# Patient Record
Sex: Male | Born: 1980 | Race: Black or African American | Hispanic: No | Marital: Married | Smoking: Never smoker
Health system: Southern US, Community
[De-identification: ages and names within clinical notes are randomized; demographics above are authoritative.]

## PROBLEM LIST (undated history)

## (undated) DIAGNOSIS — Z789 Other specified health status: Secondary | ICD-10-CM

---

## 2013-06-15 ENCOUNTER — Ambulatory Visit: Payer: Self-pay | Admitting: Family Medicine

## 2017-03-07 ENCOUNTER — Ambulatory Visit: Payer: 59 | Admitting: Urology

## 2017-03-07 ENCOUNTER — Encounter: Payer: Self-pay | Admitting: Urology

## 2017-03-07 VITALS — BP 168/91 | HR 80 | Ht 70.0 in | Wt 200.0 lb

## 2017-03-07 DIAGNOSIS — N4340 Spermatocele of epididymis, unspecified: Secondary | ICD-10-CM | POA: Diagnosis not present

## 2017-03-07 NOTE — Progress Notes (Signed)
03/07/2017 3:36 PM   Renaee Munda. 11/28/1980 [id number]  Referring provider: Dione Housekeeper, MD [address] [place], [place] [address]  Chief Complaint  Patient presents with  . New Patient (Initial Visit)    HPI: Douglas Sampson is a 37 year old male seen in consultation at the request of Dr. Greggory Stallion for evaluation of a left hemiscrotal mass.  He was initially seen for this problem in 2015 and was noted to have a 2.3 x 1.3 x 2.1 cystic mass in the left hemiscrotum.  He was seen by urology at [facility] on 08/11/2014 and the mass had increased in size to 2.2 x 3.4 x 2.0 cm.  He also had a 2 mm intratesticular cyst.  He has not been seen since 2016.  He has noted some increased size in the left hemiscrotal mass along with mild discomfort.  He has no voiding complaints.  Denies dysuria or gross hematuria.  Denies flank, abdominal, pelvic or scrotal pain.  A scrotal sonogram was ordered but has not yet been scheduled.    PMH: No past medical history on file.  Surgical History: History reviewed. No pertinent surgical history.  Home Medications:  Allergies as of 03/07/2017   No Known Allergies     Medication List    as of 03/07/2017 11:59 PM   You have not been prescribed any medications.     Allergies: No Known Allergies  Family History: Family History  Problem Relation Age of Onset  . Prostate cancer Maternal Uncle   . Kidney disease Neg Hx   . Kidney cancer Neg Hx     Social History:  reports that  has never smoked. he has never used smokeless tobacco. He reports that he does not drink alcohol or use drugs.  ROS: UROLOGY Frequent Urination?: No Hard to postpone urination?: No Burning/pain with urination?: No Get up at night to urinate?: No Leakage of urine?: No Urine stream starts and stops?: No Trouble starting stream?: No Do you have to strain to urinate?: No Blood in urine?: No Urinary tract infection?: No Sexually transmitted disease?: No Injury  to kidneys or bladder?: No Painful intercourse?: No Weak stream?: No Erection problems?: No Penile pain?: No  Gastrointestinal Nausea?: No Vomiting?: No Indigestion/heartburn?: No Diarrhea?: No Constipation?: No  Constitutional Fever: No Night sweats?: No Weight loss?: No Fatigue?: No  Skin Skin rash/lesions?: No Itching?: No  Eyes Blurred vision?: No Double vision?: No  Ears/Nose/Throat Sore throat?: No Sinus problems?: No  Hematologic/Lymphatic Swollen glands?: No Easy bruising?: No  Cardiovascular Leg swelling?: No Chest pain?: No  Respiratory Cough?: No Shortness of breath?: No  Endocrine Excessive thirst?: No  Musculoskeletal Back pain?: No Joint pain?: No  Neurological Headaches?: No Dizziness?: No  Psychologic Depression?: No Anxiety?: No  Physical Exam: BP (!) 168/91   Pulse 80   Ht 5\' 10"  (1.778 m)   Wt 200 lb (90.7 kg)   BMI 28.70 kg/m   Constitutional:  Alert and oriented, No acute distress. HEENT: [place] AT, moist mucus membranes.  Trachea midline, no masses. Cardiovascular: No clubbing, cyanosis, or edema.  CV RRR Respiratory: Normal respiratory effort, no increased work of breathing.  Lungs clear GI: Abdomen is soft, nontender, nondistended, no abdominal masses GU: No CVA tenderness.  Penis without lesions.  Right testes descended bilaterally without masses or tenderness. ~5 cm cystic supratesticular mass left hemiscrotum.  Left testis palpably normal. Skin: No rashes, bruises or suspicious lesions. Lymph: No cervical or inguinal adenopathy. Neurologic: Grossly  intact, no focal deficits, moving all 4 extremities. Psychiatric: Normal mood and affect.   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  I discussed management options of continued observation versus excision.  The decision for surgery should be based on his level of discomfort and interference with routine  activities.  He would like to think over these options.  Will review his scrotal ultrasound after it is performed.   Riki AltesScott C Jerre Diguglielmo, MD  [facility] [address], [address] [place], [place] [address] [phone]

## 2017-03-09 ENCOUNTER — Encounter: Payer: Self-pay | Admitting: Urology

## 2017-04-03 ENCOUNTER — Encounter: Payer: Self-pay | Admitting: Urology

## 2017-04-03 ENCOUNTER — Ambulatory Visit: Payer: 59 | Admitting: Urology

## 2017-04-03 VITALS — BP 139/81 | HR 71 | Resp 16 | Ht 69.0 in | Wt 204.0 lb

## 2017-04-03 DIAGNOSIS — N433 Hydrocele, unspecified: Secondary | ICD-10-CM | POA: Diagnosis not present

## 2017-04-04 NOTE — Progress Notes (Signed)
04/03/2017 7:06 AM   Douglas Mundaichard L Amster Jr. 10-25-1980 [id number]  Referring provider: Rayetta HumphreyGeorge, Sionne A, MD [address] [place], [place] [id number]  Chief complaint: Follow-up  HPI: 37 year old male seen on 03/07/2017 for an enlarging left hemiscrotal mass.  [facility].  The testes were normal in appearance.  The cystic mass in the left hemiscrotum measured 4.8 x 2.0 x 5.3 cm which is approximately doubled in size from the prior scrotal sonogram of 2016.   PMH: No past medical history on file.  Surgical History: No past surgical history on file.  Home Medications:  Allergies as of 04/03/2017   No Known Allergies     Medication List    as of 04/03/2017 11:59 PM   You have not been prescribed any medications.     Allergies: No Known Allergies  Family History: Family History  Problem Relation Age of Onset  . Prostate cancer Maternal Uncle   . Kidney disease Neg Hx   . Kidney cancer Neg Hx     Social History:  reports that  has never smoked. he has never used smokeless tobacco. He reports that he does not drink alcohol or use drugs.  ROS: UROLOGY Frequent Urination?: No Hard to postpone urination?: No Burning/pain with urination?: No Get up at night to urinate?: No Leakage of urine?: No Urine stream starts and stops?: No Trouble starting stream?: No Do you have to strain to urinate?: No Blood in urine?: No Urinary tract infection?: No Sexually transmitted disease?: No Injury to kidneys or bladder?: No Painful intercourse?: No Weak stream?: No Erection problems?: No Penile pain?: No  Gastrointestinal Nausea?: No Vomiting?: No Indigestion/heartburn?: No Diarrhea?: No Constipation?: No  Constitutional Fever: No Night sweats?: No Weight loss?: No Fatigue?: No  Skin Skin rash/lesions?: No Itching?: No  Eyes Blurred vision?: No Double vision?: No  Ears/Nose/Throat Sore throat?: No Sinus  problems?: No  Hematologic/Lymphatic Swollen glands?: No Easy bruising?: No  Cardiovascular Leg swelling?: No Chest pain?: No  Respiratory Cough?: No Shortness of breath?: No  Endocrine Excessive thirst?: No  Musculoskeletal Back pain?: No Joint pain?: No  Neurological Headaches?: No Dizziness?: No  Psychologic Depression?: No Anxiety?: No  Physical Exam: BP 139/81   Pulse 71   Resp 16   Ht 5\' 9"  (1.753 m)   Wt 204 lb (92.5 kg)   SpO2 98%   BMI 30.13 kg/m   Constitutional:  Alert and oriented, No acute distress. HEENT: [place] AT, moist mucus membranes.  Trachea midline, no masses. Cardiovascular: No clubbing, cyanosis, or edema. Respiratory: Normal respiratory effort, no increased work of breathing. GI: Abdomen is soft, nontender, nondistended, no abdominal masses GU: No CVA tenderness Lymph: No cervical or inguinal lymphadenopathy. Skin: No rashes, bruises or suspicious lesions. Neurologic: Grossly intact, no focal deficits, moving all 4 extremities. Psychiatric: Normal mood and affect.  Laboratory Data: No results found for: WBC, HGB, HCT, MCV, PLT  No results found for: CREATININE  No results found for: PSA  No results found for: TESTOSTERONE  No results found for: HGBA1C  Urinalysis No results found for: COLORURINE, APPEARANCEUR, LABSPEC, PHURINE, GLUCOSEU, HGBUR, BILIRUBINUR, KETONESUR, PROTEINUR, UROBILINOGEN, NITRITE, LEUKOCYTESUR  Pertinent Imaging: N/a  Assessment & Plan:   37 year old male with an enlarging left hemiscrotal mass with mild symptoms.  We discussed options of continued surveillance versus excision.  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  Due to the doubling in size over the last  several years he would like to schedule excision.  The procedure was discussed in detail including potential risks of bleeding/hematoma, infection and recurrence.  The potential need for a postoperative drain was discussed.  He indicated  all questions were answered to his satisfaction and desires to proceed.    Riki Altes, MD  [facility] [address], [address] [place], [place] [address] [phone]

## 2017-04-13 ENCOUNTER — Encounter: Payer: Self-pay | Admitting: Urology

## 2017-04-16 ENCOUNTER — Other Ambulatory Visit: Payer: Self-pay | Admitting: Radiology

## 2017-04-16 DIAGNOSIS — N433 Hydrocele, unspecified: Secondary | ICD-10-CM

## 2017-05-07 ENCOUNTER — Encounter
Admission: RE | Admit: 2017-05-07 | Discharge: 2017-05-07 | Disposition: A | Discharge status: Home / Self Care | Payer: 59 | Source: Ambulatory Visit | Attending: Urology | Admitting: Urology

## 2017-05-07 ENCOUNTER — Other Ambulatory Visit: Payer: Self-pay

## 2017-05-07 NOTE — Patient Instructions (Signed)
Your procedure is scheduled on: 05-13-17 TUESDAY Report to Same Day Surgery 2nd floor medical mall [facility](Medical Mall Entrance-take elevator on left to 2nd floor.  Check in with surgery information desk.) To find out your arrival time please call [phone] between 1PM - 3PM on 05-12-17 MONDAY  Remember: Instructions that are not followed completely may result in serious medical risk, up to and including death, or upon the discretion of your surgeon and anesthesiologist your surgery may need to be rescheduled.    _x___ 1. Do not eat food after midnight the night before your procedure. You may drink clear liquids up to 2 hours before you are scheduled to arrive at the hospital for your procedure.  Do not drink clear liquids within 2 hours of your scheduled arrival to the hospital.  Clear liquids include  --Water or Apple juice without pulp  --Clear carbohydrate beverage such as ClearFast or Gatorade  --Black Coffee or Clear Tea (No milk, no creamers, do not add anything to the coffee or Tea Type 1 and type 2 diabetics should only drink water.  No gum chewing or hard candies.     __x__ 2. No Alcohol for 24 hours before or after surgery.   __x__3. No Smoking or e-cigarettes for 24 prior to surgery.  Do not use any chewable tobacco products for at least 6 hour prior to surgery   ____  4. Bring all medications with you on the day of surgery if instructed.    __x__ 5. Notify your doctor if there is any change in your medical condition     (cold, fever, infections).    x___6. On the morning of surgery brush your teeth with toothpaste and water.  You may rinse your mouth with mouth wash if you wish.  Do not swallow any toothpaste or mouthwash.   Do not wear jewelry, make-up, hairpins, clips or nail polish.  Do not wear lotions, powders, or perfumes. You may wear deodorant.  Do not shave 48 hours prior to surgery. Men may shave face and neck.  Do not bring valuables to the hospital.    [facility] is not responsible for any belongings or valuables.               Contacts, dentures or bridgework may not be worn into surgery.  Leave your suitcase in the car. After surgery it may be brought to your room.  For patients admitted to the hospital, discharge time is determined by your treatment team.  _  Patients discharged the day of surgery will not be allowed to drive home.  You will need someone to drive you home and stay with you the night of your procedure.   ____ Take anti-hypertensive listed below, cardiac, seizure, asthma, anti-reflux and psychiatric medicines. These include:  1. NONE  2.  3.  4.  5.  6.  ____Fleets enema or Magnesium Citrate as directed.   ____ Use CHG Soap or sage wipes as directed on instruction sheet   ____ Use inhalers on the day of surgery and bring to hospital day of surgery  ____ Stop Metformin and Janumet 2 days prior to surgery.    ____ Take 1/2 of usual insulin dose the night before surgery and none on the morning surgery.   ____ Follow recommendations from Cardiologist, Pulmonologist or PCP regarding stopping Aspirin, Coumadin, Plavix ,Eliquis, Effient, or Pradaxa, and Pletal.  X____Stop Anti-inflammatories such as Advil, Aleve, Ibuprofen, Motrin, Naproxen, Naprosyn, Goodies powders or aspirin products NOW-OK  to take Tylenol    ____ Stop supplements until after surgery.     ____ Bring C-Pap to the hospital.

## 2017-05-13 ENCOUNTER — Encounter: Payer: Self-pay | Admitting: Urology

## 2017-05-13 ENCOUNTER — Ambulatory Visit
Admission: RE | Admit: 2017-05-13 | Discharge: 2017-05-13 | Disposition: A | Discharge status: Home / Self Care | Payer: 59 | Source: Ambulatory Visit | Attending: Urology | Admitting: Urology

## 2017-05-13 ENCOUNTER — Ambulatory Visit: Payer: 59 | Admitting: Anesthesiology

## 2017-05-13 ENCOUNTER — Encounter
Admission: RE | Disposition: A | Discharge status: Home / Self Care | Payer: Self-pay | Source: Ambulatory Visit | Attending: Urology

## 2017-05-13 ENCOUNTER — Encounter: Payer: Self-pay | Admitting: *Deleted

## 2017-05-13 ENCOUNTER — Ambulatory Visit: Payer: 59

## 2017-05-13 DIAGNOSIS — N4341 Spermatocele of epididymis, single: Secondary | ICD-10-CM | POA: Diagnosis not present

## 2017-05-13 DIAGNOSIS — N433 Hydrocele, unspecified: Secondary | ICD-10-CM | POA: Insufficient documentation

## 2017-05-13 DIAGNOSIS — R049 Hemorrhage from respiratory passages, unspecified: Secondary | ICD-10-CM

## 2017-05-13 DIAGNOSIS — N5089 Other specified disorders of the male genital organs: Secondary | ICD-10-CM | POA: Diagnosis present

## 2017-05-13 DIAGNOSIS — R042 Hemoptysis: Secondary | ICD-10-CM | POA: Diagnosis not present

## 2017-05-13 DIAGNOSIS — N4340 Spermatocele of epididymis, unspecified: Secondary | ICD-10-CM

## 2017-05-13 HISTORY — PX: SPERMATOCELECTOMY: SHX2420

## 2017-05-13 SURGERY — EXCISION, SPERMATOCELE
Anesthesia: General | Laterality: Left | Wound class: Clean Contaminated

## 2017-05-13 MED ORDER — ONDANSETRON HCL 4 MG/2ML IJ SOLN
INTRAMUSCULAR | Status: DC | PRN
  Administered 2017-05-13: 4 mg via INTRAVENOUS

## 2017-05-13 MED ORDER — LACTATED RINGERS IV SOLN
INTRAVENOUS | Status: DC
  Administered 2017-05-13: 07:00:00 via INTRAVENOUS

## 2017-05-13 MED ORDER — ACETAMINOPHEN 10 MG/ML IV SOLN
INTRAVENOUS | Status: AC
  Filled 2017-05-13: qty 100

## 2017-05-13 MED ORDER — PROPOFOL 10 MG/ML IV BOLUS
INTRAVENOUS | Status: DC | PRN
  Administered 2017-05-13: 200 mg via INTRAVENOUS

## 2017-05-13 MED ORDER — GLYCOPYRROLATE 0.2 MG/ML IJ SOLN
INTRAMUSCULAR | Status: DC | PRN
  Administered 2017-05-13: 0.2 mg via INTRAVENOUS

## 2017-05-13 MED ORDER — FAMOTIDINE 20 MG PO TABS
20.0000 mg | ORAL_TABLET | Freq: Once | ORAL | Status: AC
  Administered 2017-05-13: 20 mg via ORAL

## 2017-05-13 MED ORDER — EPHEDRINE SULFATE 50 MG/ML IJ SOLN
INTRAMUSCULAR | Status: AC
  Filled 2017-05-13: qty 1

## 2017-05-13 MED ORDER — PROPOFOL 10 MG/ML IV BOLUS
INTRAVENOUS | Status: AC
  Filled 2017-05-13: qty 40

## 2017-05-13 MED ORDER — PHENYLEPHRINE HCL 10 MG/ML IJ SOLN
INTRAMUSCULAR | Status: AC
  Filled 2017-05-13: qty 1

## 2017-05-13 MED ORDER — LIDOCAINE HCL (PF) 1 % IJ SOLN
INTRAMUSCULAR | Status: AC
  Filled 2017-05-13: qty 30

## 2017-05-13 MED ORDER — CEFAZOLIN SODIUM-DEXTROSE 2-4 GM/100ML-% IV SOLN
2.0000 g | INTRAVENOUS | Status: AC
  Administered 2017-05-13: 2 g via INTRAVENOUS
  Filled 2017-05-13: qty 100

## 2017-05-13 MED ORDER — MIDAZOLAM HCL 2 MG/2ML IJ SOLN
INTRAMUSCULAR | Status: AC
  Filled 2017-05-13: qty 2

## 2017-05-13 MED ORDER — GLYCOPYRROLATE 0.2 MG/ML IJ SOLN
INTRAMUSCULAR | Status: AC
  Filled 2017-05-13: qty 2

## 2017-05-13 MED ORDER — BUPIVACAINE HCL 0.5 % IJ SOLN
INTRAMUSCULAR | Status: DC | PRN
  Administered 2017-05-13: 4 mL

## 2017-05-13 MED ORDER — SUCCINYLCHOLINE CHLORIDE 20 MG/ML IJ SOLN
INTRAMUSCULAR | Status: AC
  Filled 2017-05-13: qty 1

## 2017-05-13 MED ORDER — FENTANYL CITRATE (PF) 100 MCG/2ML IJ SOLN
INTRAMUSCULAR | Status: DC | PRN
  Administered 2017-05-13 (×2): 50 ug via INTRAVENOUS

## 2017-05-13 MED ORDER — FAMOTIDINE 20 MG PO TABS
ORAL_TABLET | ORAL | Status: AC
  Administered 2017-05-13: 20 mg via ORAL
  Filled 2017-05-13: qty 1

## 2017-05-13 MED ORDER — DEXAMETHASONE SODIUM PHOSPHATE 10 MG/ML IJ SOLN
INTRAMUSCULAR | Status: DC | PRN
  Administered 2017-05-13: 10 mg via INTRAVENOUS

## 2017-05-13 MED ORDER — LIDOCAINE HCL (CARDIAC) 20 MG/ML IV SOLN
INTRAVENOUS | Status: DC | PRN
  Administered 2017-05-13: 100 mg via INTRAVENOUS

## 2017-05-13 MED ORDER — CEFAZOLIN SODIUM-DEXTROSE 2-3 GM-%(50ML) IV SOLR
INTRAVENOUS | Status: AC
  Filled 2017-05-13: qty 50

## 2017-05-13 MED ORDER — ACETAMINOPHEN 10 MG/ML IV SOLN
INTRAVENOUS | Status: DC | PRN
  Administered 2017-05-13: 1000 mg via INTRAVENOUS

## 2017-05-13 MED ORDER — LIDOCAINE HCL (PF) 2 % IJ SOLN
INTRAMUSCULAR | Status: AC
  Filled 2017-05-13: qty 10

## 2017-05-13 MED ORDER — FENTANYL CITRATE (PF) 100 MCG/2ML IJ SOLN
INTRAMUSCULAR | Status: AC
  Filled 2017-05-13: qty 2

## 2017-05-13 MED ORDER — MIDAZOLAM HCL 2 MG/2ML IJ SOLN
INTRAMUSCULAR | Status: DC | PRN
  Administered 2017-05-13: 2 mg via INTRAVENOUS

## 2017-05-13 MED ORDER — KETOROLAC TROMETHAMINE 30 MG/ML IJ SOLN
INTRAMUSCULAR | Status: DC | PRN
  Administered 2017-05-13: 30 mg via INTRAVENOUS

## 2017-05-13 MED ORDER — ONDANSETRON HCL 4 MG/2ML IJ SOLN
INTRAMUSCULAR | Status: AC
  Filled 2017-05-13: qty 2

## 2017-05-13 MED ORDER — BUPIVACAINE HCL (PF) 0.5 % IJ SOLN
INTRAMUSCULAR | Status: AC
  Filled 2017-05-13: qty 30

## 2017-05-13 MED ORDER — OXYCODONE-ACETAMINOPHEN 5-325 MG PO TABS: 1.0000 | ORAL_TABLET | Freq: Four times a day (QID) | ORAL | 0 refills | Status: DC | PRN

## 2017-05-13 SURGICAL SUPPLY — 36 items
BLADE CLIPPER SURG (BLADE) ×4 IMPLANT
BLADE SURG 15 STRL LF DISP TIS (BLADE) ×2 IMPLANT
BLADE SURG 15 STRL SS (BLADE) ×2
CANISTER SUCT 1200ML W/VALVE (MISCELLANEOUS) ×4 IMPLANT
CHLORAPREP W/TINT 26ML (MISCELLANEOUS) ×4 IMPLANT
DERMABOND ADVANCED (GAUZE/BANDAGES/DRESSINGS) ×2
DERMABOND ADVANCED .7 DNX12 (GAUZE/BANDAGES/DRESSINGS) ×2 IMPLANT
DRAIN PENROSE 1/4X12 LTX (DRAIN) ×4 IMPLANT
DRAPE LAPAROTOMY 77X122 PED (DRAPES) ×4 IMPLANT
DRSG GAUZE FLUFF 36X18 (GAUZE/BANDAGES/DRESSINGS) ×4 IMPLANT
ELECT REM PT RETURN 9FT ADLT (ELECTROSURGICAL) ×4
ELECTRODE REM PT RTRN 9FT ADLT (ELECTROSURGICAL) ×2 IMPLANT
GAUZE SPONGE 4X4 12PLY STRL (GAUZE/BANDAGES/DRESSINGS) IMPLANT
GLOVE BIO SURGEON STRL SZ8 (GLOVE) ×4 IMPLANT
GOWN STRL REUS W/ TWL LRG LVL3 (GOWN DISPOSABLE) ×2 IMPLANT
GOWN STRL REUS W/TWL LRG LVL3 (GOWN DISPOSABLE) ×2
GOWN STRL REUS W/TWL XL LVL4 (GOWN DISPOSABLE) ×4 IMPLANT
KIT TURNOVER KIT A (KITS) ×4 IMPLANT
LABEL OR SOLS (LABEL) ×4 IMPLANT
NEEDLE HYPO 25X1 1.5 SAFETY (NEEDLE) ×4 IMPLANT
NS IRRIG 500ML POUR BTL (IV SOLUTION) ×4 IMPLANT
PACK BASIN MINOR ARMC (MISCELLANEOUS) ×4 IMPLANT
SUPPORETR ATHLETIC LG (MISCELLANEOUS) ×2 IMPLANT
SUPPORTER ATHLETIC LG (MISCELLANEOUS) ×4
SUT CHROMIC 3 0 PS 2 (SUTURE) IMPLANT
SUT CHROMIC 3 0 SH 27 (SUTURE) ×8 IMPLANT
SUT ETHILON 3-0 FS-10 30 BLK (SUTURE)
SUT ETHILON NAB PS2 4-0 18IN (SUTURE) IMPLANT
SUT MNCRL 3-0 UNDYED SH (SUTURE) ×2 IMPLANT
SUT MONOCRYL 3-0 UNDYED (SUTURE) ×2
SUT VIC AB 3-0 SH 27 (SUTURE) ×4
SUT VIC AB 3-0 SH 27X BRD (SUTURE) ×4 IMPLANT
SUT VIC AB 4-0 SH 27 (SUTURE)
SUT VIC AB 4-0 SH 27XANBCTRL (SUTURE) IMPLANT
SUTURE EHLN 3-0 FS-10 30 BLK (SUTURE) IMPLANT
SYR 10ML LL (SYRINGE) ×4 IMPLANT

## 2017-05-13 NOTE — Transfer of Care (Signed)
Immediate Anesthesia Transfer of Care Note  Patient: Douglas MundaRichard L Newbury Jr.  Procedure(s) Performed: Procedure(s): SPERMATOCELECTOMY  Patient Location: PACU  Anesthesia Type:General  Level of Consciousness: sedated  Airway & Oxygen Therapy: Patient Spontanous Breathing and Patient connected to face mask oxygen  Post-op Assessment: Report given to RN and Post -op Vital signs reviewed and stable  Post vital signs: Reviewed and stable  Last Vitals:  Vitals:   05/13/17 0635 05/13/17 0835  BP: 135/86 (!) 159/70  Pulse: 72 91  Resp: 18 18  Temp: 36.8 C 36.8 C  SpO2: 100% 92%    Complications: No apparent anesthesia complications

## 2017-05-13 NOTE — Anesthesia Post-op Follow-up Note (Signed)
Anesthesia QCDR form completed.        

## 2017-05-13 NOTE — Anesthesia Preprocedure Evaluation (Signed)
Anesthesia Evaluation  Patient identified by MRN, date of birth, ID band Patient awake    Reviewed: Allergy & Precautions, NPO status , Patient's Chart, lab work & pertinent test results  History of Anesthesia Complications Negative for: history of anesthetic complications  Airway Mallampati: II  TM Distance: >3 FB Neck ROM: Full    Dental no notable dental hx.    Pulmonary neg pulmonary ROS, neg sleep apnea, neg COPD,    breath sounds clear to auscultation- rhonchi (-) wheezing      Cardiovascular Exercise Tolerance: Good (-) hypertension(-) CAD and (-) Past MI  Rhythm:Regular Rate:Normal - Systolic murmurs and - Diastolic murmurs    Neuro/Psych negative neurological ROS  negative psych ROS   GI/Hepatic negative GI ROS, Neg liver ROS,   Endo/Other  negative endocrine ROSneg diabetes  Renal/GU negative Renal ROS     Musculoskeletal negative musculoskeletal ROS (+)   Abdominal (+) + obese,   Peds  Hematology negative hematology ROS (+)   Anesthesia Other Findings   Reproductive/Obstetrics                             Anesthesia Physical Anesthesia Plan  ASA: II  Anesthesia Plan: General   Post-op Pain Management:    Induction: Intravenous  PONV Risk Score and Plan: 1 and Ondansetron, Dexamethasone and Midazolam  Airway Management Planned: LMA  Additional Equipment:   Intra-op Plan:   Post-operative Plan:   Informed Consent: I have reviewed the patients History and Physical, chart, labs and discussed the procedure including the risks, benefits and alternatives for the proposed anesthesia with the patient or authorized representative who has indicated his/her understanding and acceptance.   Dental advisory given  Plan Discussed with: CRNA and Anesthesiologist  Anesthesia Plan Comments:         Anesthesia Quick Evaluation

## 2017-05-13 NOTE — Anesthesia Postprocedure Evaluation (Signed)
Anesthesia Post Note  Patient: Douglas MundaRichard L Mcguinness Jr.  Procedure(s) Performed: SPERMATOCELECTOMY  Patient location during evaluation: PACU Anesthesia Type: General Level of consciousness: awake and alert and oriented Pain management: pain level controlled Vital Signs Assessment: post-procedure vital signs reviewed and stable Respiratory status: spontaneous breathing, nonlabored ventilation and respiratory function stable Cardiovascular status: blood pressure returned to baseline and stable Postop Assessment: no signs of nausea or vomiting Anesthetic complications: no     Last Vitals:  Vitals:   05/13/17 0850 05/13/17 0905  BP: 114/74 (!) 143/84  Pulse: (!) 105 99  Resp: 16 18  Temp:    SpO2: 97% 95%    Last Pain:  Vitals:   05/13/17 0905  TempSrc:   PainSc: 0-No pain                 Cythnia Osmun

## 2017-05-13 NOTE — Discharge Instructions (Signed)

## 2017-05-13 NOTE — Progress Notes (Signed)
Pt noted to have frequent coughing. This RN auscultated pt and noted bilateral congestion. Dr. Priscella MannPenwarden notified. Acknowledged. No new orders. Yasenia Reedy E 9:20 AM 05/13/2017

## 2017-05-13 NOTE — Anesthesia Procedure Notes (Signed)
Procedure Name: LMA Insertion Date/Time: 05/13/2017 7:37 AM Performed by: Stormy Fabianurtis, Arelly Whittenberg, CRNA Pre-anesthesia Checklist: Patient identified, Patient being monitored, Timeout performed, Emergency Drugs available and Suction available Patient Re-evaluated:Patient Re-evaluated prior to induction Oxygen Delivery Method: Circle system utilized Preoxygenation: Pre-oxygenation with 100% oxygen Induction Type: IV induction Ventilation: Mask ventilation without difficulty LMA: LMA inserted LMA Size: 4.5 Tube type: Oral Number of attempts: 1 Placement Confirmation: positive ETCO2 and breath sounds checked- equal and bilateral Tube secured with: Tape Dental Injury: Teeth and Oropharynx as per pre-operative assessment

## 2017-05-13 NOTE — H&P (Signed)
 @  ZOXWRUE@ENCDATE@ 7:23 AM   Douglas Mundaichard L Henk Jr. [birth year] [id number]  Referring provider: No referring provider defined for this encounter.  No chief complaint on file.   HPI: [age] year old male seen on 03/07/2017 for an enlarging left hemiscrotal mass.  [facility].  The testes were normal in appearance.  The cystic mass in the left hemiscrotum measured 4.8 x 2.0 x 5.3 cm which is approximately doubled in size from the prior scrotal sonogram of 2016.   PMH: History reviewed. No pertinent past medical history.  Surgical History: Past Surgical History:  Procedure Laterality Date  . NO PAST SURGERIES      Home Medications:  Reviewed  Allergies: No Known Allergies  Family History: Family History  Problem Relation Age of Onset  . Prostate cancer Maternal Uncle   . Kidney disease Neg Hx   . Kidney cancer Neg Hx     Social History:  reports that he has never smoked. He has never used smokeless tobacco. He reports that he drinks alcohol. He reports that he does not use drugs.  ROS:12 point ROS neg except as per hpi   Physical Exam: BP 135/86   Pulse 72   Temp 98.2 F (36.8 C) (Oral)   Resp 18   Ht 5\' 9"  (1.753 m)   Wt 204 lb (92.5 kg)   SpO2 100%   BMI 30.13 kg/m   Constitutional:  Alert and oriented, No acute distress. HEENT: [place] AT, moist mucus membranes.  Trachea midline, no masses. Cardiovascular: No clubbing, cyanosis, or edema. Respiratory: Normal respiratory effort, no increased work of breathing. GI: Abdomen is soft, nontender, nondistended, no abdominal masses.  GU: No CVA tenderness. Large left hemiscrotal mass. Lymph: No cervical or inguinal lymphadenopathy. Skin: No rashes, bruises or suspicious lesions. Neurologic: Grossly intact, no focal deficits, moving all 4 extremities. Psychiatric: Normal mood and affect.   . Assessment & Plan:   37 year old male with an enlarging left hemiscrotal mass with  mild symptoms.  We discussed options of continued surveillance versus excision.  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  Due to the doubling in size over the last several years he would like to schedule excision.  The procedure was discussed in detail including potential risks of bleeding/hematoma, infection and recurrence.  The potential need for a postoperative drain was discussed.  He indicated all questions were answered to his satisfaction and desires to proceed.      Riki AltesScott C Mairi Stagliano, MD  [facility] [address], [address] [place], [place] [address] [phone]

## 2017-05-13 NOTE — Op Note (Signed)
Preoperative diagnosis:  1. Left complex hydrocele  Postoperative diagnosis:  1. Left spermatocele  Procedure: 1. Spermatocelectomy  Surgeon: Riki AltesScott C Stoioff, MD  Anesthesia: General  Complications: None  Intraoperative findings: Multiloculated spermatocele of the globus minor  EBL: Minimal  Specimens: None  Indication: Douglas Mundaichard L Hipp Jr. is a 37 y.o. patient with a several year history of gradual left hemiscrotal swelling.  Scrotal sonogram was felt to represent a complex hydrocele/possible spermatocele.  After reviewing the management options for treatment, he elected to proceed with the above surgical procedure(s). We have discussed the potential benefits and risks of the procedure, side effects of the proposed treatment, the likelihood of the patient achieving the goals of the procedure, and any potential problems that might occur during the procedure or recuperation. Informed consent has been obtained.  Description of procedure:  The patient was taken to the operating room and general anesthesia was induced.  The patient was placed in the dorsal lithotomy position, prepped and draped in the usual sterile fashion, and preoperative antibiotics were administered. A preoperative time-out was performed.   Examination under anesthesia was performed.  The testis was palpable superior to the left hemiscrotal mass.  A transverse skin incision was made in the midportion of the left hemiscrotum.  This incision was carried through the dartos fascia down to the testis.  The testis and cystic mass were delivered into the operative field.  There was a large cystic structure inferior to the testis consistent with a spermatocele.  The tunica vaginalis was opened and no hydrocele fluid was present.  The investing tissue over the spermatocele was incised and opened with cautery.  The spermatocele was then dissected free from the epididymis with a combination of blunt dissection, cautery and sharp  dissection.  The spermatocele was excised intact.  No significant bleeding was noted.  There was a redundant appendix testis and appendix epididymis which were cauterized.  The testis was then irrigated and placed back into the left hemiscrotum in its anatomic position.  1/4 inch Penrose drain was placed through the dependent portion of the left hemiscrotum through a separate stab incision and secured with 0 silk suture.  The skin incision was anesthetized with half percent plain Sensorcaine.  The dartos was closed with a running 3-0 chromic suture.  The skin was closed with a running horizontal mattress suture of 3-0 chromic.  Dermabond, fluffs and a scrotal support was applied.  After anesthetic reversal the patient was transported to the PACU in stable condition.    Riki AltesScott C Sampson, M.D.

## 2017-05-13 NOTE — Interval H&P Note (Signed)
History and Physical Interval Note:  05/13/2017 7:27 AM  Douglas Carolee RotaL Stempel Jr.  has presented today for surgery, with the diagnosis of left hydrocele  The various methods of treatment have been discussed with the patient and family. After consideration of risks, benefits and other options for treatment, the patient has consented to  Procedure(s): HYDROCELECTOMY ADULT (Left) as a surgical intervention .  The patient's history has been reviewed, patient examined, no change in status, stable for surgery.  I have reviewed the patient's chart and labs.  Questions were answered to the patient's satisfaction.     Scott C Stoioff

## 2017-05-14 ENCOUNTER — Emergency Department: Payer: 59

## 2017-05-14 ENCOUNTER — Other Ambulatory Visit: Payer: Self-pay

## 2017-05-14 ENCOUNTER — Ambulatory Visit: Payer: 59 | Admitting: Family Medicine

## 2017-05-14 ENCOUNTER — Encounter: Payer: Self-pay | Admitting: *Deleted

## 2017-05-14 ENCOUNTER — Ambulatory Visit: Payer: 59 | Admitting: Pulmonary Disease

## 2017-05-14 ENCOUNTER — Encounter: Payer: Self-pay | Admitting: Emergency Medicine

## 2017-05-14 ENCOUNTER — Emergency Department
Admission: EM | Admit: 2017-05-14 | Discharge: 2017-05-14 | Disposition: A | Discharge status: Home / Self Care | Payer: 59 | Source: Home / Self Care | Attending: Emergency Medicine | Admitting: Emergency Medicine

## 2017-05-14 ENCOUNTER — Encounter: Payer: Self-pay | Admitting: Pulmonary Disease

## 2017-05-14 VITALS — BP 148/89 | HR 90 | Ht 69.0 in | Wt 205.5 lb

## 2017-05-14 VITALS — BP 130/82 | HR 91 | Ht 71.0 in | Wt 205.0 lb

## 2017-05-14 DIAGNOSIS — R0489 Hemorrhage from other sites in respiratory passages: Secondary | ICD-10-CM

## 2017-05-14 DIAGNOSIS — R918 Other nonspecific abnormal finding of lung field: Secondary | ICD-10-CM

## 2017-05-14 DIAGNOSIS — R042 Hemoptysis: Secondary | ICD-10-CM

## 2017-05-14 DIAGNOSIS — N433 Hydrocele, unspecified: Secondary | ICD-10-CM

## 2017-05-14 HISTORY — DX: Other specified health status: Z78.9

## 2017-05-14 LAB — PROTIME-INR
INR: 1
Prothrombin Time: 13.1 seconds (ref 11.4–15.2)

## 2017-05-14 LAB — SURGICAL PATHOLOGY

## 2017-05-14 LAB — TYPE AND SCREEN
ABO/RH(D): O POS
Antibody Screen: NEGATIVE

## 2017-05-14 LAB — BASIC METABOLIC PANEL
ANION GAP: 7 (ref 5–15)
BUN: 13 mg/dL (ref 6–20)
CALCIUM: 8.4 mg/dL — AB (ref 8.9–10.3)
CO2: 24 mmol/L (ref 22–32)
Chloride: 104 mmol/L (ref 101–111)
Creatinine, Ser: 1.02 mg/dL (ref 0.61–1.24)
Glucose, Bld: 110 mg/dL — ABNORMAL HIGH (ref 65–99)
Potassium: 3.5 mmol/L (ref 3.5–5.1)
Sodium: 135 mmol/L (ref 135–145)

## 2017-05-14 LAB — CBC WITH DIFFERENTIAL/PLATELET
Basophils Absolute: 0 10*3/uL (ref 0–0.1)
Basophils Relative: 0 %
EOS ABS: 0 10*3/uL (ref 0–0.7)
Eosinophils Relative: 0 %
HEMATOCRIT: 37.3 % — AB (ref 40.0–52.0)
Hemoglobin: 12.6 g/dL — ABNORMAL LOW (ref 13.0–18.0)
LYMPHS PCT: 8 %
Lymphs Abs: 1.1 10*3/uL (ref 1.0–3.6)
MCH: 31.8 pg (ref 26.0–34.0)
MCHC: 33.9 g/dL (ref 32.0–36.0)
MCV: 93.8 fL (ref 80.0–100.0)
MONOS PCT: 5 %
Monocytes Absolute: 0.7 10*3/uL (ref 0.2–1.0)
NEUTROS PCT: 87 %
Neutro Abs: 11.8 10*3/uL — ABNORMAL HIGH (ref 1.4–6.5)
PLATELETS: 304 10*3/uL (ref 150–440)
RBC: 3.97 MIL/uL — AB (ref 4.40–5.90)
RDW: 12.3 % (ref 11.5–14.5)
WBC: 13.6 10*3/uL — ABNORMAL HIGH (ref 3.8–10.6)

## 2017-05-14 LAB — TROPONIN I: Troponin I: <0.03 ng/mL (ref <0.03)

## 2017-05-14 MED ORDER — POTASSIUM CHLORIDE CRYS ER 20 MEQ PO TBCR: 40.0000 meq | EXTENDED_RELEASE_TABLET | Freq: Once | ORAL | Status: DC

## 2017-05-14 MED ORDER — METHYLPREDNISOLONE SODIUM SUCC 125 MG IJ SOLR
125.0000 mg | Freq: Once | INTRAMUSCULAR | Status: AC
  Administered 2017-05-14: 125 mg via INTRAVENOUS
  Filled 2017-05-14: qty 2

## 2017-05-14 MED ORDER — OXYCODONE-ACETAMINOPHEN 5-325 MG PO TABS
1.0000 | ORAL_TABLET | Freq: Once | ORAL | Status: AC
  Administered 2017-05-14: 1 via ORAL
  Filled 2017-05-14: qty 1

## 2017-05-14 MED ORDER — SODIUM CHLORIDE 0.9 % IV BOLUS
1000.0000 mL | Freq: Once | INTRAVENOUS | Status: AC
  Administered 2017-05-14: 1000 mL via INTRAVENOUS

## 2017-05-14 MED ORDER — IOHEXOL 350 MG/ML SOLN
75.0000 mL | Freq: Once | INTRAVENOUS | Status: AC | PRN
  Administered 2017-05-14: 75 mL via INTRAVENOUS

## 2017-05-14 NOTE — Progress Notes (Signed)
Patient presents today for a Pin Rose drain removal. The suture was clipped from the bottom of the left testicle and the drain was removed with no complications. A pad was placed on the open wound to collect any drainage.   Performed by: Fonnie Muarrie Lova Urbieta,CMA, Eligha BridegroomSarah Watts, CMA

## 2017-05-14 NOTE — ED Triage Notes (Signed)
Patient ambulatory to triage with steady gait, without difficulty or distress noted; pt reports cyst removed from left testicle yesterday am; has had some hemoptysis since going home

## 2017-05-14 NOTE — H&P (Signed)
[facility] [place] [facility]   PATIENT NAME: Douglas Sampson    MR#:  [id number]  DATE OF BIRTH:  1980-03-25  DATE OF ADMISSION:  05/14/2017  PRIMARY CARE PHYSICIAN: Rayetta Humphrey, MD   REQUESTING/REFERRING PHYSICIAN: Dr Chiquita Loth  CHIEF COMPLAINT:   Chief Complaint  Patient presents with  . Cough    HISTORY OF PRESENT ILLNESS:  Douglas Sampson  is a 36 y.o. male with recent surgery yesterday under general anesthesia.  He had a cyst taking off his left testicle.  Patient felt well after surgery.  He started coughing up blood quarter cup each time he coughs up blood.  He coughed up blood 4-5 times.  He is feeling better now.  Has not coughed up blood since he came in.  He is wanting the drain out have his scrotum.  He does not want to be admitted.  PAST MEDICAL HISTORY:   Past Medical History:  Diagnosis Date  . Medical history non-contributory     PAST SURGICAL HISTORY:   Past Surgical History:  Procedure Laterality Date  . SPERMATOCELECTOMY  05/13/2017   Procedure: SPERMATOCELECTOMY;  Surgeon: Riki Altes, MD;  Location: [facility] ORS;  Service: Urology;;    SOCIAL HISTORY:   Social History   Tobacco Use  . Smoking status: Never Smoker  . Smokeless tobacco: Never Used  Substance Use Topics  . Alcohol use: Yes    Frequency: Never    Comment: BEER OCC    FAMILY HISTORY:   Family History  Problem Relation Age of Onset  . Prostate cancer Maternal Uncle   . Healthy Mother   . Hypertension Father   . Kidney disease Neg Hx   . Kidney cancer Neg Hx     DRUG ALLERGIES:  No Known Allergies  REVIEW OF SYSTEMS:  CONSTITUTIONAL: No fever, fatigue or weakness.  EYES: No blurred or double vision.  EARS, NOSE, AND THROAT: No tinnitus or ear pain. No sore throat RESPIRATORY: Slight cough, shortness of breath, and hemoptysis.  CARDIOVASCULAR: No chest pain, orthopnea, edema.  GASTROINTESTINAL: No nausea, vomiting, diarrhea or  abdominal pain. No blood in bowel movements GENITOURINARY: No dysuria, hematuria.  ENDOCRINE: No polyuria, nocturia,  HEMATOLOGY: No anemia, easy bruising or bleeding SKIN: No rash or lesion. MUSCULOSKELETAL: No joint pain or arthritis.   NEUROLOGIC: No tingling, numbness, weakness.  PSYCHIATRY: No anxiety or depression.   MEDICATIONS AT HOME:   Prior to Admission medications   Medication Sig Start Date End Date Taking? Authorizing Provider  fluticasone (FLONASE) 50 MCG/ACT nasal spray Place 1 spray into both nostrils daily as needed for allergies or rhinitis.    [provider]  oxyCODONE-acetaminophen (PERCOCET/ROXICET) 5-325 MG tablet Take 1 tablet by mouth every 6 (six) hours as needed for severe pain. 05/13/17   Stoioff, Verna Czech, MD      VITAL SIGNS:  Blood pressure 134/78, pulse 91, temperature 98.1 F (36.7 C), temperature source Oral, resp. rate 18, height 5\' 9"  (1.753 m), weight 90.7 kg (200 lb), SpO2 92 %.  PHYSICAL EXAMINATION:  GENERAL:  37 y.o.-year-old patient lying in the bed with no acute distress.  EYES: Pupils equal, round, reactive to light and accommodation. No scleral icterus. Extraocular muscles intact.  HEENT: Head atraumatic, normocephalic. Oropharynx and nasopharynx clear.  NECK:  Supple, no jugular venous distention. No thyroid enlargement, no tenderness.  LUNGS: Normal breath sounds bilaterally, no wheezing.  slight rhonchi  at the bases. No use of accessory muscles of  respiration.  CARDIOVASCULAR: S1, S2 normal. No murmurs, rubs, or gallops.  ABDOMEN: Soft, nontender, nondistended. Bowel sounds present. No organomegaly or mass.  EXTREMITIES: No pedal edema, cyanosis, or clubbing.  NEUROLOGIC: Cranial nerves II through XII are intact. Muscle strength 5/5 in all extremities. Sensation intact. Gait not checked.  PSYCHIATRIC: The patient is alert and oriented x 3.  SKIN: No rash, lesion, or ulcer.   LABORATORY PANEL:   CBC Recent Labs  Lab  05/14/17 0110  WBC 13.6*  HGB 12.6*  HCT 37.3*  PLT 304   ------------------------------------------------------------------------------------------------------------------  Chemistries  Recent Labs  Lab 05/14/17 0110  NA 135  K 3.5  CL 104  CO2 24  GLUCOSE 110*  BUN 13  CREATININE 1.02  CALCIUM 8.4*   ------------------------------------------------------------------------------------------------------------------  Cardiac Enzymes Recent Labs  Lab 05/14/17 0110  TROPONINI <0.03   ------------------------------------------------------------------------------------------------------------------  RADIOLOGY:  Dg Chest 2 View  Result Date: 05/14/2017 CLINICAL DATA:  Acute onset of hemoptysis. 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 EXAM: CHEST - 2 VIEW COMPARISON:  None. FINDINGS: The lungs are well-aerated. Diffuse patchy airspace opacities are noted bilaterally. These are concerning for pulmonary alveolar hemorrhage given the patient's presentation. There is no evidence of pleural effusion or pneumothorax. The heart is normal in size; the mediastinal contour is within normal limits. No acute osseous abnormalities are seen. IMPRESSION: Diffuse bilateral patchy airspace opacities are concerning for diffuse pulmonary alveolar hemorrhage given the patient's presentation. This can be seen with inhaled halogenated anesthetic gases such as sevoflurane, or secondary to negative pulmonary pressure during surgery. These results were called by telephone at the time of interpretation on 05/14/2017 at 12:48 am to Dr. Dolores Frame, who verbally acknowledged these results. Electronically Signed   By: Roanna Raider M.D.   On: 05/14/2017 00:51   Ct Angio Chest Pe W/cm &/or Wo Cm  Result Date: 05/14/2017 CLINICAL DATA:  Hemoptysis EXAM: CT ANGIOGRAPHY CHEST WITH CONTRAST TECHNIQUE: Multidetector CT imaging of the chest was performed using the standard protocol during bolus administration  of intravenous contrast. Multiplanar CT image reconstructions and MIPs were obtained to evaluate the vascular anatomy. CONTRAST:  75mL OMNIPAQUE IOHEXOL 350 MG/ML SOLN COMPARISON:  Chest x-ray 05/14/2017 FINDINGS: Cardiovascular: Satisfactory opacification of the pulmonary arteries to the segmental level. No evidence of pulmonary embolism. Nonaneurysmal aorta. No dissection is seen. Normal heart size. No pericardial effusion. Mediastinum/Nodes: No enlarged mediastinal, hilar, or axillary lymph nodes. Thyroid gland, trachea, and esophagus demonstrate no significant findings. Lungs/Pleura: Relatively symmetric bilateral nodular areas of consolidation and ground-glass density. No pleural effusion. No pneumothorax. Upper Abdomen: No acute abnormality. Musculoskeletal: No chest wall abnormality. No acute or significant osseous findings. Review of the MIP images confirms the above findings. IMPRESSION: 1. Negative for acute pulmonary embolus or aortic dissection 2. Relatively symmetric bilateral nodular consolidations and ground-glass densities; given history, most likely represents alveolar hemorrhage. Could also consider inhalation injury or diffuse pneumonia if clinical symptoms are suggestive of infection. Electronically Signed   By: Jasmine Pang M.D.   On: 05/14/2017 03:49    EKG:   Normal sinus rhythm 69 bpm  IMPRESSION AND PLAN:   1.  Diffuse alveolar hemorrhage postoperatively.  ER physician gave steroids.  The patient warrants a observation but the patient does not want to come in.  Case discussed with Dr. Dolores Frame  ER physician.  She will contact pulmonary to set up an outpatient plan.  Likely the patient will need a steroid taper.  They will watch a little further  in the emergency room and if no further coughing he wants to go home.  If they change their mind and want to be observed here in the hospital they can call me back and we can do it.  I will bill as an ER consult if the patient does not come into  the hospital. 2.  Recent surgery by Dr. Lonna CobbStoioff with drain in the scrotum.  ER physician will call Dr. Lonna CobbStoioff to see when he can remove the drain 3.  Leukocytosis likely from coughing up blood and recent surgery 4.  Hypokalemia replace potassium orally  All the records are reviewed and case discussed with ED provider. Management plans discussed with the patient, family and they are in agreement.  CODE STATUS: Full code  TOTAL TIME TAKING CARE OF THIS PATIENT: 45 minutes.    Alford Highlandichard Lativia Velie M.D on 05/14/2017 at 7:49 AM  Between 7am to 6pm - Pager - [phone]  After 6pm call admission pager [phone]  [facility] Office  [phone]  CC: Primary care physician; Rayetta HumphreyGeorge, Sionne A, MD

## 2017-05-14 NOTE — Patient Instructions (Addendum)
No smoking of anything for now Keep your activity level relatively moderate.  No vigorous exercise It is reasonable to take the next couple of days off from work if you feel it is necessary  Follow-up chest x-ray next week.  I would like you to get this around Wednesday 4/24 or Thursday 4/25  Be certain to call our office if any symptoms worsen including increased shortness of breath, worsening hemoptysis (coughing up of blood), fever, chest pain, change in mucus to purulent (yellow or green)  Follow-up with me on 05/26/17 at which time we will review chest x-rays and determine whether further investigation is warranted.

## 2017-05-14 NOTE — ED Provider Notes (Addendum)
[facility] Emergency Department Provider Note   ____________________________________________   First MD Initiated Contact with Patient 05/14/17 [phone]     (approximate)  I have reviewed the triage vital signs and the nursing notes.   HISTORY  Chief Complaint Cough    HPI Douglas MundaRichard L Macquarrie Jr. is a 37 y.o. male who presents to the ED from home with a chief complaint of hemoptysis.  Patient underwent spermatocele removal under general anesthesia yesterday.  After he got home, he reports coughing with bright red blood noted.  Denies associated pain or shortness of breath.  Over the course of the evening the offices has lessened.  However, he was concerned and presents to the ED for evaluation.  Denies fever, chills, chest pain, shortness of breath, abdominal pain, nausea, vomiting, diarrhea.  States he has not had to take any pain medicine for the surgical site but it is now feeling sore.  Denies use of anticoagulants.   Past medical history None  There are no active problems to display for this patient.   Past Surgical History:  Procedure Laterality Date  . SPERMATOCELECTOMY  05/13/2017   Procedure: SPERMATOCELECTOMY;  Surgeon: Riki AltesStoioff, Scott C, MD;  Location: [facility] ORS;  Service: Urology;;    Prior to Admission medications   Medication Sig Start Date End Date Taking? Authorizing Provider  fluticasone (FLONASE) 50 MCG/ACT nasal spray Place 1 spray into both nostrils daily as needed for allergies or rhinitis.    [provider]  oxyCODONE-acetaminophen (PERCOCET/ROXICET) 5-325 MG tablet Take 1 tablet by mouth every 6 (six) hours as needed for severe pain. 05/13/17   Stoioff, Verna CzechScott C, MD    Allergies Patient has no known allergies.  Family History  Problem Relation Age of Onset  . Prostate cancer Maternal Uncle   . Healthy Mother   . Hypertension Father   . Kidney disease Neg Hx   . Kidney cancer Neg Hx     Social History Social History     Tobacco Use  . Smoking status: Never Smoker  . Smokeless tobacco: Never Used  Substance Use Topics  . Alcohol use: Yes    Frequency: Never    Comment: BEER OCC  . Drug use: No    Review of Systems  Constitutional: No fever/chills. Eyes: No visual changes. ENT: No sore throat. Cardiovascular: Denies chest pain. Respiratory: Positive for hemoptysis.  Denies shortness of breath. Gastrointestinal: No abdominal pain.  No nausea, no vomiting.  No diarrhea.  No constipation. Genitourinary: Negative for dysuria. Musculoskeletal: Negative for back pain. Skin: Negative for rash. Neurological: Negative for headaches, focal weakness or numbness.   ____________________________________________   PHYSICAL EXAM:  VITAL SIGNS: ED Triage Vitals  Enc Vitals Group     BP 05/14/17 0014 (!) 147/72     Pulse Rate 05/14/17 0014 75     Resp 05/14/17 0014 18     Temp 05/14/17 0014 98.1 F (36.7 C)     Temp Source 05/14/17 0014 Oral     SpO2 05/14/17 0014 97 %     Weight 05/14/17 0012 200 lb (90.7 kg)     Height 05/14/17 0012 5\' 9"  (1.753 m)     Head Circumference --      Peak Flow --      Pain Score 05/14/17 0012 0     Pain Loc --      Pain Edu? --      Excl. in GC? --     Constitutional: Alert and  oriented. Well appearing and in no acute distress. Eyes: Conjunctivae are normal. PERRL. EOMI. Head: Atraumatic. Nose: No congestion/rhinnorhea. Mouth/Throat: Mucous membranes are moist.  Oropharynx non-erythematous. Neck: No stridor.   Cardiovascular: Normal rate, regular rhythm. Grossly normal heart sounds.  Good peripheral circulation. Respiratory: Normal respiratory effort.  No retractions. Lungs CTAB. Gastrointestinal: Soft and nontender. No distention. No abdominal bruits. No CVA tenderness. Musculoskeletal: No lower extremity tenderness nor edema.  No joint effusions. Neurologic:  Normal speech and language. No gross focal neurologic deficits are appreciated. No gait  instability. Skin:  Skin is warm, dry and intact. No rash noted. Psychiatric: Mood and affect are normal. Speech and behavior are normal.  ____________________________________________   LABS (all labs ordered are listed, but only abnormal results are displayed)  Labs Reviewed  CBC WITH DIFFERENTIAL/PLATELET - Abnormal; Notable for the following components:      Result Value   WBC 13.6 (*)    RBC 3.97 (*)    Hemoglobin 12.6 (*)    HCT 37.3 (*)    Neutro Abs 11.8 (*)    All other components within normal limits  BASIC METABOLIC PANEL - Abnormal; Notable for the following components:   Glucose, Bld 110 (*)    Calcium 8.4 (*)    All other components within normal limits  PROTIME-INR  TROPONIN I  TYPE AND SCREEN   ____________________________________________  EKG  ED ECG REPORT I, Elmor Kost J, the attending physician, personally viewed and interpreted this ECG.   Date: 05/14/2017  EKG Time: 0159  Rate: 69  Rhythm: normal EKG, normal sinus rhythm  Axis: Normal  Intervals:none  ST&T Change: Nonspecific  ____________________________________________  RADIOLOGY  ED MD interpretation: This x-ray discussed with Dr. Cherly Hensen which is concerning for diffuse pulmonary alveolar hemorrhage  CT negative for PE or dissection.  Diffuse pulmonary alveolar hemorrhage  Official radiology report(s): Dg Chest 2 View  Result Date: 05/14/2017 CLINICAL DATA:  Acute onset of hemoptysis. 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 EXAM: CHEST - 2 VIEW COMPARISON:  None. FINDINGS: The lungs are well-aerated. Diffuse patchy airspace opacities are noted bilaterally. These are concerning for pulmonary alveolar hemorrhage given the patient's presentation. There is no evidence of pleural effusion or pneumothorax. The heart is normal in size; the mediastinal contour is within normal limits. No acute osseous abnormalities are seen. IMPRESSION: Diffuse bilateral patchy airspace opacities are  concerning for diffuse pulmonary alveolar hemorrhage given the patient's presentation. This can be seen with inhaled halogenated anesthetic gases such as sevoflurane, or secondary to negative pulmonary pressure during surgery. These results were called by telephone at the time of interpretation on 05/14/2017 at 12:48 am to Dr. Dolores Frame, who verbally acknowledged these results. Electronically Signed   By: Roanna Raider M.D.   On: 05/14/2017 00:51   Ct Angio Chest Pe W/cm &/or Wo Cm  Result Date: 05/14/2017 CLINICAL DATA:  Hemoptysis EXAM: CT ANGIOGRAPHY CHEST WITH CONTRAST TECHNIQUE: Multidetector CT imaging of the chest was performed using the standard protocol during bolus administration of intravenous contrast. Multiplanar CT image reconstructions and MIPs were obtained to evaluate the vascular anatomy. CONTRAST:  75mL OMNIPAQUE IOHEXOL 350 MG/ML SOLN COMPARISON:  Chest x-ray 05/14/2017 FINDINGS: Cardiovascular: Satisfactory opacification of the pulmonary arteries to the segmental level. No evidence of pulmonary embolism. Nonaneurysmal aorta. No dissection is seen. Normal heart size. No pericardial effusion. Mediastinum/Nodes: No enlarged mediastinal, hilar, or axillary lymph nodes. Thyroid gland, trachea, and esophagus demonstrate no significant findings. Lungs/Pleura: Relatively symmetric bilateral nodular areas of  consolidation and ground-glass density. No pleural effusion. No pneumothorax. Upper Abdomen: No acute abnormality. Musculoskeletal: No chest wall abnormality. No acute or significant osseous findings. Review of the MIP images confirms the above findings. IMPRESSION: 1. Negative for acute pulmonary embolus or aortic dissection 2. Relatively symmetric bilateral nodular consolidations and ground-glass densities; given history, most likely represents alveolar hemorrhage. Could also consider inhalation injury or diffuse pneumonia if clinical symptoms are suggestive of infection. Electronically Signed   By:  Jasmine Pang M.D.   On: 05/14/2017 03:49    ____________________________________________   PROCEDURES  Procedure(s) performed: None  Procedures  Critical Care performed: No  ____________________________________________   INITIAL IMPRESSION / ASSESSMENT AND PLAN / ED COURSE  As part of my medical decision making, I reviewed the following data within the electronic MEDICAL RECORD NUMBER History obtained from family, Nursing notes reviewed and incorporated, Labs reviewed, EKG interpreted, Old chart reviewed, Radiograph reviewed and Notes from prior ED visits   37 year old otherwise healthy male who presents with hemoptysis status post recent intubation for spermatocele surgery. Differential diagnosis includes but is not limited to PE, pneumonia, postoperative complication, etc.  Chest x-ray concerning for diffuse alveolar hemorrhage.  Will obtain basic lab work and CT angios chest to further characterize hemoptysis.  Clinical Course as of May 15 811  Wed May 14, 2017  0415 Updated patient and spouse of CT imaging results.  Will administer IV Solu-Medrol.  Will discuss with hospitalist to evaluate patient in the emergency department.  Phone call to pulmonologist for additional recommendations.  Will place consult for urology given that patient was supposed to have his drain removed today.   [JS]  [id number] Will obtain type and screen and place second PIV.   [JS]  0755 After patient was seen by Dr. Hilton Sinclair from hospitalist services, he declined hospitalization.  I was able to speak with Dr. Belia Heman from pulmonary services. Given that patient has not had hemoptysis in 8 hours, is not SOB or hypoxic, Dr. Belia Heman thinks it is reasonable to discharge patient home. Does not recommend Prednisone on discharge. He advised calling the pulmonary clinic at [phone]. If there is an available spot this morning, patient will be discharged from the emergency department directly to the pulmonary clinic.  I will also  have my staff call Dr. Heywood Footman office to see when his postop drain needs to be removed.  If it is to be removed this morning, then it might be possible for patient to go by the clinic to have that removed.  Return precautions given.  Patient and spouse verbalize understanding and agree with plan of care.   [JS]    Clinical Course User Index [JS] Irean Hong, MD     ____________________________________________   FINAL CLINICAL IMPRESSION(S) / ED DIAGNOSES  Final diagnoses:  Hemoptysis  Diffuse pulmonary alveolar hemorrhage     ED Discharge Orders    None       Note:  This document was prepared using Dragon voice recognition software and may include unintentional dictation errors.    Irean Hong, MD 05/14/17 0981    Irean Hong, MD 05/14/17 [phone]

## 2017-05-14 NOTE — ED Notes (Signed)
Patient ambulatory to restroom without assistance and steady gait noted. Patient and family expressing frustration with waiting for results. This RN explained to patient that CT results were still pending and that the doctor would be in to review them once they resulted.

## 2017-05-14 NOTE — Discharge Instructions (Addendum)
Return to the ER for worsening symptoms, persistent vomiting, difficulty breathing or other concerns. °

## 2017-05-14 NOTE — Progress Notes (Signed)
PULMONARY CONSULT NOTE  Requesting MD/Service: [facility] ED Date of initial consultation: 05/14/17 Reason for consultation: Hemoptysis  PT PROFILE: 37 y.o. male non cigarette smoker (he does smoke marijuana occasionally) evaluated in the emergency department on the night prior to this admission for hemoptysis  CT chest -05/14/17: Diffuse bilateral fluffy, ill-defined nodular densities in all lobes with a central predominance.  HPI:  Previously healthy 37 year old male who underwent elective spermatocelectomy on 05/13/17.  His procedure was performed under general anesthesia.  There are no reports of intraoperative complications nor any complications related to intubation or extubation.  He was discharged home after the procedure.  Yesterday evening, he developed hemoptysis on several occasions.  He presented to the emergency department with this complaint.  At the time he denied fever, chills, chest pain, shortness of breath, lower extremity edema, calf pain.  He continues to deny these symptoms.  He underwent a chest x-ray with findings as discussed below.  He underwent a CT scan with findings as discussed above.  He does not take any anticoagulants including aspirin.  He was offered admission but refused due to the fact that he felt fine otherwise.  Therefore, this consultation was arranged.  At this time, he has no new complaints.  The hemoptysis has all but resolved.  He denies the symptoms as documented above.  He has been in his usual state of health except for "allergy symptoms" over the past 2 weeks.  He does admit to smoking marijuana couple times per week.  He has no significant occupational exposures.  He is employed as a Environmental consultantpharmaceutical representative.  Past Medical History:  Diagnosis Date  . Medical history non-contributory     Past Surgical History:  Procedure Laterality Date  . SPERMATOCELECTOMY  05/13/2017   Procedure: SPERMATOCELECTOMY;  Surgeon: Riki AltesStoioff, Scott C, MD;  Location: [facility]  ORS;  Service: Urology;;    MEDICATIONS: I have reviewed all medications and confirmed regimen as documented  Social History   Socioeconomic History  . Marital status: Married    Spouse name: Not on file  . Number of children: Not on file  . Years of education: Not on file  . Highest education level: Not on file  Occupational History  . Not on file  Social Needs  . Financial resource strain: Not on file  . Food insecurity:    Worry: Not on file    Inability: Not on file  . Transportation needs:    Medical: Not on file    Non-medical: Not on file  Tobacco Use  . Smoking status: Never Smoker  . Smokeless tobacco: Never Used  Substance and Sexual Activity  . Alcohol use: Yes    Frequency: Never    Comment: BEER OCC  . Drug use: No  . Sexual activity: Not on file  Lifestyle  . Physical activity:    Days per week: Not on file    Minutes per session: Not on file  . Stress: Not on file  Relationships  . Social connections:    Talks on phone: Not on file    Gets together: Not on file    Attends religious service: Not on file    Active member of club or organization: Not on file    Attends meetings of clubs or organizations: Not on file    Relationship status: Not on file  . Intimate partner violence:    Fear of current or ex partner: Not on file    Emotionally abused: Not on file  Physically abused: Not on file    Forced sexual activity: Not on file  Other Topics Concern  . Not on file  Social History Narrative  . Not on file    Family History  Problem Relation Age of Onset  . Prostate cancer Maternal Uncle   . Healthy Mother   . Hypertension Father   . Kidney disease Neg Hx   . Kidney cancer Neg Hx     ROS: No fever, myalgias/arthralgias, unexplained weight loss or weight gain No new focal weakness or sensory deficits No otalgia, hearing loss, visual changes, nasal and sinus symptoms, mouth and throat problems No neck pain or adenopathy No abdominal  pain, N/V/D, diarrhea, change in bowel pattern No dysuria, change in urinary pattern   Vitals:   05/14/17 1113 05/14/17 1117  BP:  130/82  Pulse:  91  SpO2:  96%  Weight: 205 lb (93 kg)   Height: 5\' 11"  (1.803 m)      EXAM:   Gen: WDWN in NAD HEENT: NCAT, sclerae white, oropharynx normal Neck: No LAN, no JVD noted Lungs: full BS, normal percussion note throughout, no adventitious sounds Cardiovascular: Regular, normal rate, no M noted Abdomen: Soft, NT, +BS Ext: no C/C/E Neuro: PERRL, EOMI, motor/sensory grossly intact Skin: No lesions noted   DATA:   BMP Latest Ref Rng & Units 05/14/2017  Glucose 65 - 99 mg/dL 161(W)  BUN 6 - 20 mg/dL 13  Creatinine 9.60 - 4.54 mg/dL 0.98  Sodium 119 - 147 mmol/L 135  Potassium 3.5 - 5.1 mmol/L 3.5  Chloride 101 - 111 mmol/L 104  CO2 22 - 32 mmol/L 24  Calcium 8.9 - 10.3 mg/dL 8.2(N)    CBC Latest Ref Rng & Units 05/14/2017  WBC 3.8 - 10.6 K/uL 13.6(H)  Hemoglobin 13.0 - 18.0 g/dL 12.6(L)  Hematocrit 40.0 - 52.0 % 37.3(L)  Platelets 150 - 440 K/uL 304    CXR:    I have personally reviewed all chest radiographs reported above including CXRs and CT chest unless otherwise indicated  IMPRESSION:     ICD-10-CM   1. CXR pattern consistent with intra-alveolar hemorrhage - this is likely a negative pressure phenomenon at the time of extubation R04.89 DG Chest 2 View  2. Hemoptysis R04.2 DG Chest 2 View  3. Pulmonary infiltrates R91.8 DG Chest 2 View   We discussed at length my impression that this is most likely an extubation (negative pressure) phenomenon.  Seems highly unlikely to be infectious given his absence of symptoms.  Overall, it seems to be solving.  Therefore, we have agreed to a strategy of expectant management.  PLAN:  1) I have emphasized that he should not smoke anything including marijuana for now. 2) I recommend that he keep his activity level relatively moderate for the next week or so.  No vigorous exercise 3)  I recommended that he may take the next couple of days off work if he feels necessary 4) he is to obtain a follow-up chest x-ray next week (that order has been entered). I will review that chest x-ray when available to me. 5) I have emphasized that he should call our office for any increased respiratory or pulmonary symptoms including shortness of breath, worsening hemoptysis, fever, chest pain, change in character of mucus.  I have provided my personal number and instructed that he may have other providers contact me directly if he has any recurrence of hemoptysis.   He will follow-up with me on 05/26/17 at which  time we will review his repeat chest x-ray and determine whether further evaluation is indicated.    Billy Fischer, MD PCCM service Mobile [phone] Pager [phone] 05/14/2017 1:59 PM

## 2017-05-22 ENCOUNTER — Ambulatory Visit
Admission: RE | Admit: 2017-05-22 | Discharge: 2017-05-22 | Disposition: A | Discharge status: Home / Self Care | Payer: 59 | Source: Ambulatory Visit | Attending: Pulmonary Disease | Admitting: Pulmonary Disease

## 2017-05-22 DIAGNOSIS — R042 Hemoptysis: Secondary | ICD-10-CM | POA: Insufficient documentation

## 2017-05-22 DIAGNOSIS — R918 Other nonspecific abnormal finding of lung field: Secondary | ICD-10-CM | POA: Diagnosis not present

## 2017-05-22 DIAGNOSIS — R0489 Hemorrhage from other sites in respiratory passages: Secondary | ICD-10-CM

## 2017-05-26 ENCOUNTER — Ambulatory Visit: Payer: 59 | Admitting: Pulmonary Disease

## 2017-05-26 ENCOUNTER — Encounter: Payer: Self-pay | Admitting: Pulmonary Disease

## 2017-05-26 VITALS — BP 142/80 | HR 68 | Ht 71.0 in | Wt 203.0 lb

## 2017-05-26 DIAGNOSIS — R042 Hemoptysis: Secondary | ICD-10-CM

## 2017-05-26 NOTE — Progress Notes (Signed)
PULMONARY OFFICE FOLLOW-UP NOTE  Requesting MD/Service: [facility] ED Date of initial consultation: 05/14/17 Reason for consultation: Hemoptysis  PT PROFILE: 37 y.o. male non cigarette smoker (he does smoke marijuana occasionally) evaluated in the emergency department on the night prior to this admission for hemoptysis  CXR 05/15/17: bilateral ill defined opacities throughout both lung CT chest 05/14/17: Diffuse bilateral fluffy, ill-defined nodular densities in all lobes with a central predominance CXR 05/22/17 Complete resolution of B infiltrates. Normal CXR  SUBJ: No complaints. No dyspnea, cough, hemoptysis. Feels back to baseline      Vitals:   05/26/17 1149 05/26/17 1152  BP:  (!) 142/80  Pulse:  68  SpO2:  99%  Weight: 203 lb (92.1 kg)   Height:  (1.803 m)   RA  EXAM:  Gen: NAD HEENT: NCAT, sclera white Neck: No JVD Lungs: breath sounds full, no wheezes or other adventitious sounds Cardiovascular: RRR, no murmurs Abdomen: Soft, nontender, normal BS Ext: without clubbing, cyanosis, edema Neuro: grossly intact Skin: Limited exam, no lesions noted    DATA:   BMP Latest Ref Rng & Units 05/14/2017  Glucose 65 - 99 mg/dL 829(F)  BUN 6 - 20 mg/dL 13  Creatinine 6.21 - 3.08 mg/dL 6.57  Sodium 846 - 962 mmol/L 135  Potassium 3.5 - 5.1 mmol/L 3.5  Chloride 101 - 111 mmol/L 104  CO2 22 - 32 mmol/L 24  Calcium 8.9 - 10.3 mg/dL 9.5(M)    CBC Latest Ref Rng & Units 05/14/2017  WBC 3.8 - 10.6 K/uL 13.6(H)  Hemoglobin 13.0 - 18.0 g/dL 12.6(L)  Hematocrit 40.0 - 52.0 % 37.3(L)  Platelets 150 - 440 K/uL 304    CXR:  As above  I have personally reviewed all chest radiographs reported above including CXRs and CT chest unless otherwise indicated  IMPRESSION:     ICD-10-CM   1. Hemoptysis, resolved R04.2    Likely due to negative pressure pulmonary edema.  Symptoms and chest x-ray have fully resolved.  PLAN:  No further evaluation is indicated Follow-up as  needed   Billy Fischer, MD PCCM service Mobile [phone] Pager [phone] 05/26/2017 12:08 PM

## 2017-06-11 ENCOUNTER — Encounter: Payer: Self-pay | Admitting: Urology

## 2017-06-11 ENCOUNTER — Ambulatory Visit (INDEPENDENT_AMBULATORY_CARE_PROVIDER_SITE_OTHER): Payer: 59 | Admitting: Urology

## 2017-06-11 VITALS — BP 153/91 | HR 65 | Resp 16 | Ht 69.0 in | Wt 199.8 lb

## 2017-06-11 DIAGNOSIS — Z09 Encounter for follow-up examination after completed treatment for conditions other than malignant neoplasm: Secondary | ICD-10-CM

## 2017-06-11 NOTE — Progress Notes (Signed)
06/11/2017 1:27 PM   Douglas Sampson. Nov 08, 1980 [id number]  Referring provider: Rayetta Humphrey, MD [address] [place], [place] [address]  Chief Complaint  Patient presents with  . Routine Post Op    HPI: 37 year old male presents for postop follow-up.  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 He did present to the ED on 4/17 with hemoptysis and cough which was thought to be secondary to negative pressure pulmonary edema at extubation.  All of his symptoms have resolved.  The pathology report remarkable for a spermatocele.  He has no complaints.  PMH: Past Medical History:  Diagnosis Date  . Medical history non-contributory     Surgical History: Past Surgical History:  Procedure Laterality Date  . SPERMATOCELECTOMY  05/13/2017   Procedure: SPERMATOCELECTOMY;  Surgeon: Riki Altes, MD;  Location: [facility] ORS;  Service: Urology;;    Home Medications:  Allergies as of 06/11/2017   No Known Allergies     Medication List        Accurate as of 06/11/17  1:27 PM. Always use your most recent med list.          fluticasone 50 MCG/ACT nasal spray Commonly known as:  FLONASE Place 1 spray into both nostrils daily as needed for allergies or rhinitis.       Allergies: No Known Allergies  Family History: Family History  Problem Relation Age of Onset  . Prostate cancer Maternal Uncle   . Healthy Mother   . Hypertension Father   . Kidney disease Neg Hx   . Kidney cancer Neg Hx     Social History:  reports that he has never smoked. He has never used smokeless tobacco. He reports that he drinks alcohol. He reports that he does not use drugs.  ROS: UROLOGY Frequent Urination?: No Hard to postpone urination?: No Burning/pain with urination?: No Get up at night to urinate?: No Leakage of urine?: No Urine stream starts and stops?: No Trouble starting stream?: No Do you have to strain to urinate?: No Blood in urine?:  No Urinary tract infection?: No Sexually transmitted disease?: No Injury to kidneys or bladder?: No Painful intercourse?: No Weak stream?: No Erection problems?: No Penile pain?: No  Gastrointestinal Nausea?: No Vomiting?: No Indigestion/heartburn?: No Diarrhea?: No Constipation?: No  Constitutional Fever: No Night sweats?: No Weight loss?: No Fatigue?: No  Skin Skin rash/lesions?: No Itching?: No  Eyes Blurred vision?: No Double vision?: No  Ears/Nose/Throat Sore throat?: No Sinus problems?: No  Hematologic/Lymphatic Swollen glands?: No Easy bruising?: No  Cardiovascular Leg swelling?: No Chest pain?: No  Respiratory Cough?: No Shortness of breath?: No  Endocrine Excessive thirst?: No  Musculoskeletal Back pain?: No Joint pain?: No  Neurological Headaches?: No Dizziness?: No  Psychologic Depression?: No Anxiety?: No  Physical Exam: BP (!) 153/91   Pulse 65   Resp 16   Ht  (1.753 m)   Wt 199 lb 12.8 oz (90.6 kg)   SpO2 96%   BMI 29.51 kg/m    Constitutional:  Alert and oriented, No acute distress. Respiratory: Normal respiratory effort, no increased work of breathing. GI: Abdomen is soft, nontender, nondistended, no abdominal  GU: Incision is healed.  Testes descended bilateral without masses or tenderness.  No scrotal edema noted.    Assessment & Plan:   Doing well status post left spermatocelctomy.  Follow-up prn.   Riki Altes, MD  [facility] [address] [place], [place] [address] [phone]  

## 2019-01-29 IMAGING — CT CT ANGIO CHEST
2 of 6 series · 19 of 46 positions shown · IV contrast (APPLIED)
Comparison: Chest x-ray 05/14/2017

CLINICAL DATA: Hemoptysis

EXAM:
CT ANGIOGRAPHY CHEST WITH CONTRAST
TECHNIQUE: Multidetector CT imaging of the chest was performed using the
standard protocol during bolus administration of intravenous
contrast. Multiplanar CT image reconstructions and MIPs were
obtained to evaluate the vascular anatomy.
CONTRAST:  75mL OMNIPAQUE IOHEXOL 350 MG/ML SOLN

[Series 5: thins · axial · 0.65mm/px · z∈[-515,-272]mm · 16 of 267 slices shown]
[im 12/267  lung]
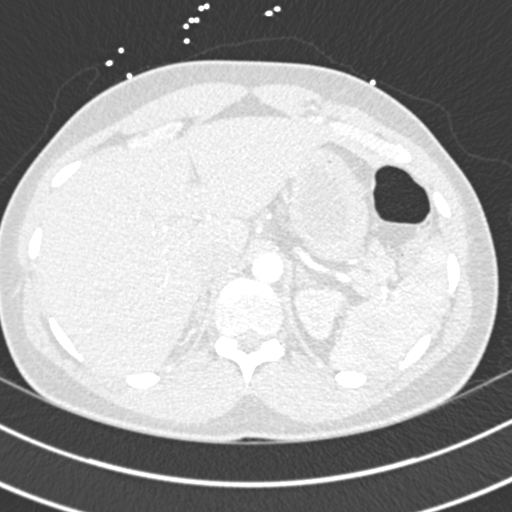
[im 35/267  soft-tissue]
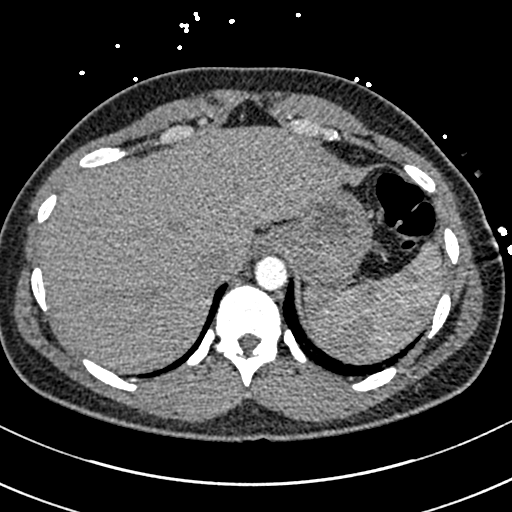
[im 47/267  lung]
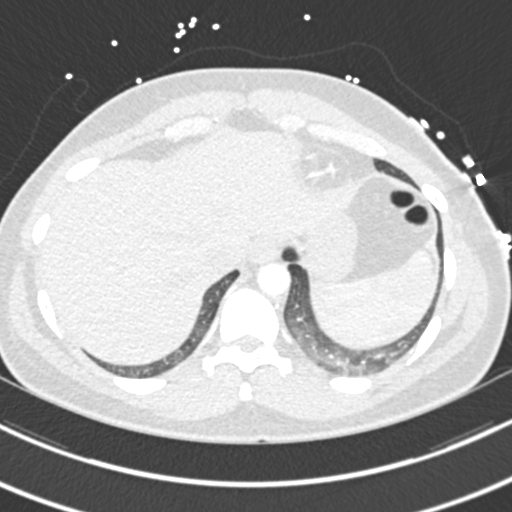
[im 58/267  soft-tissue]
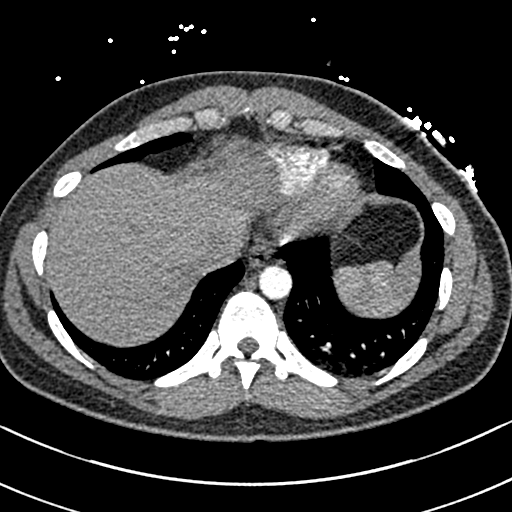
[im 81/267  lung]
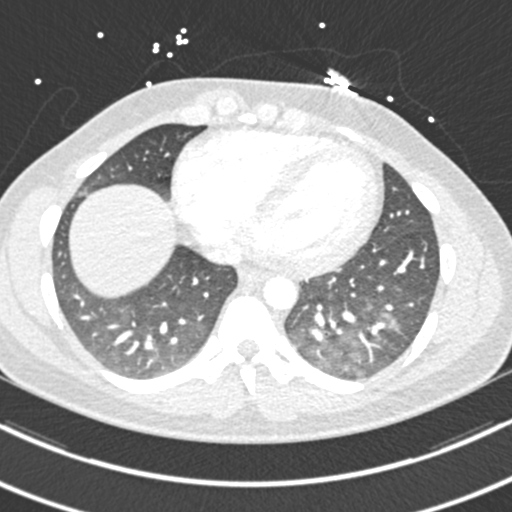
[im 93/267  soft-tissue]
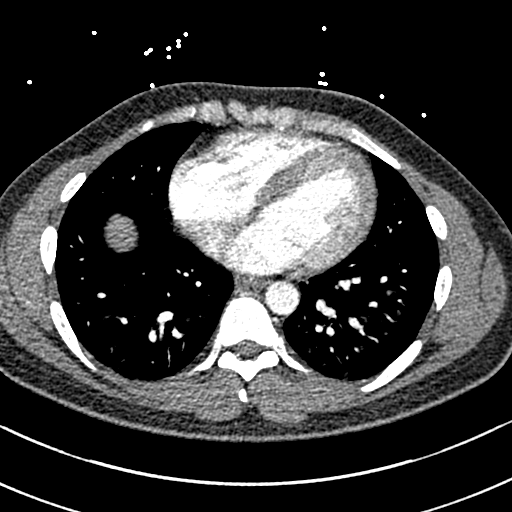
[im 105/267  lung]
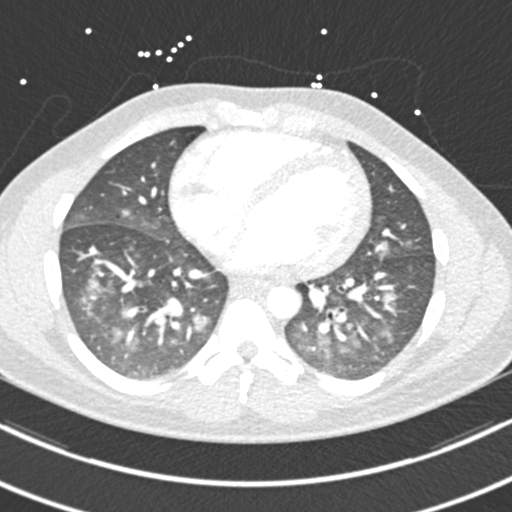
[im 128/267  soft-tissue]
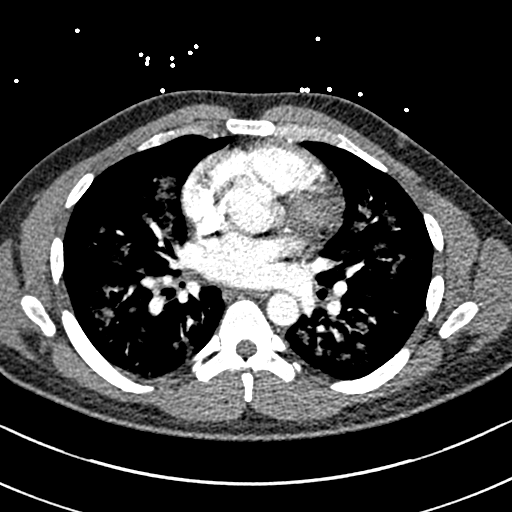
[im 139/267  lung]
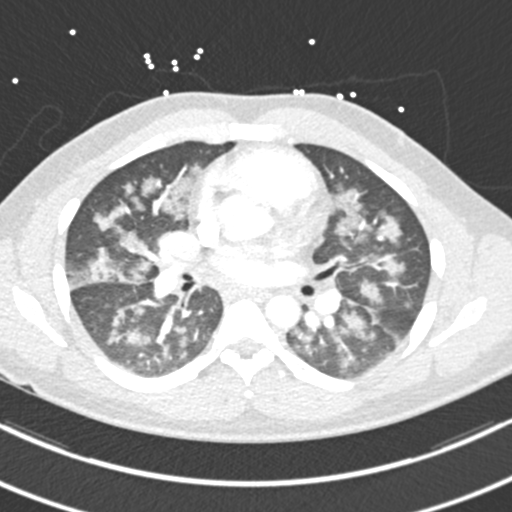
[im 162/267  soft-tissue]
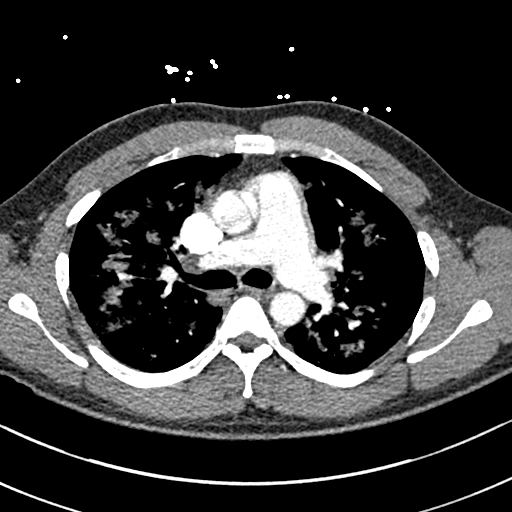
[im 174/267  lung]
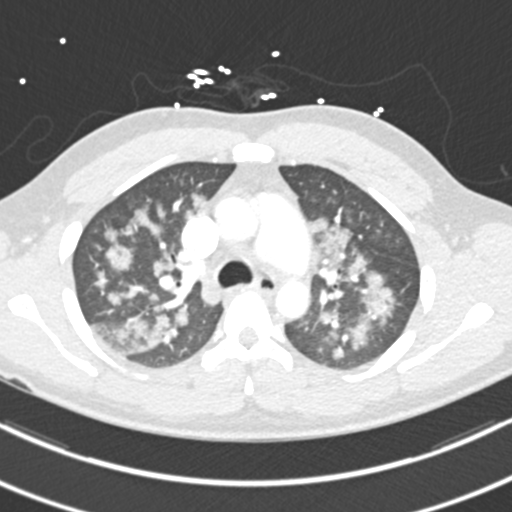
[im 186/267  soft-tissue]
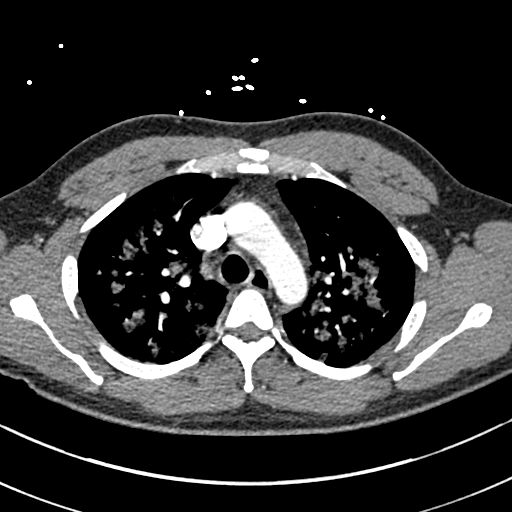
[im 209/267  lung]
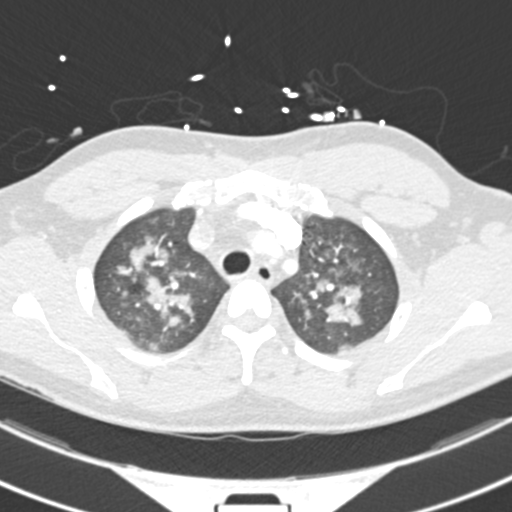
[im 220/267  soft-tissue]
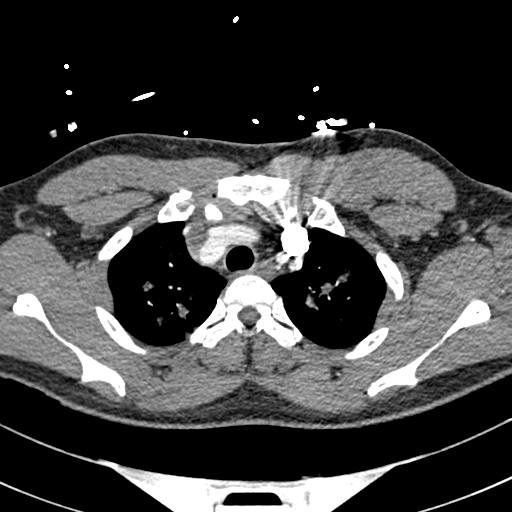
[im 232/267  lung]
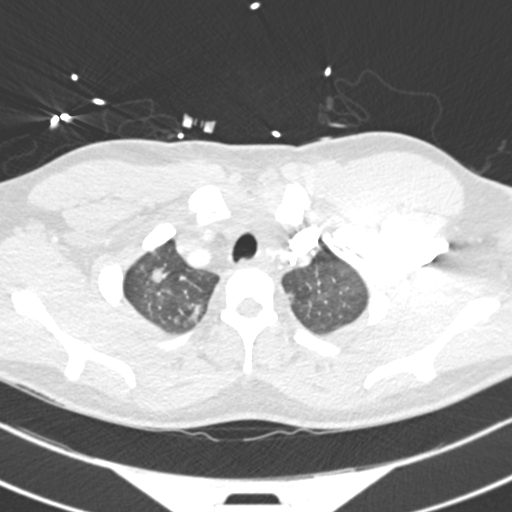
[im 255/267  soft-tissue]
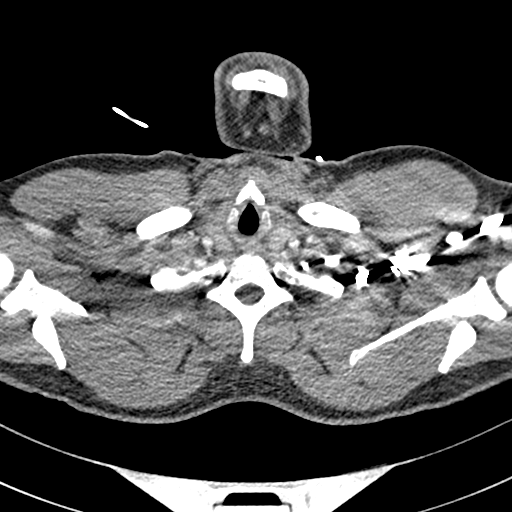

[Series 7: coronal mpr · coronal · 0.58mm/px · 3 of 74 slices shown]
[im 19/74  soft-tissue]
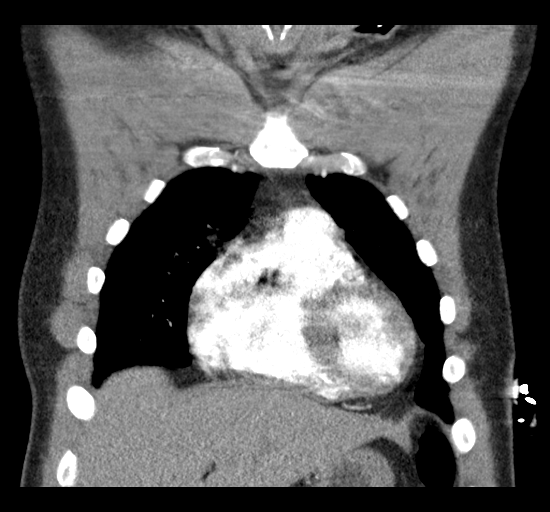
[im 37/74  soft-tissue]
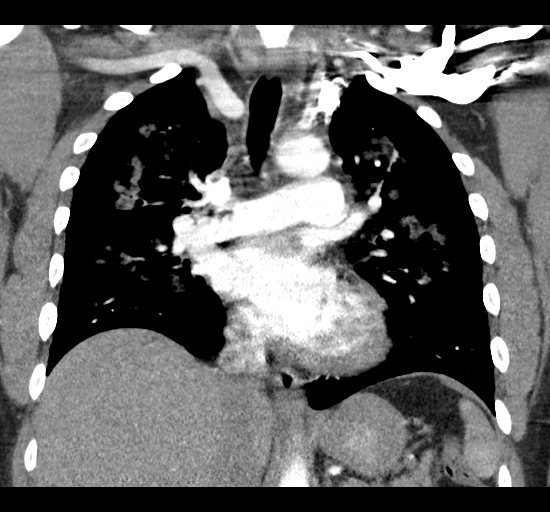
[im 55/74  soft-tissue]
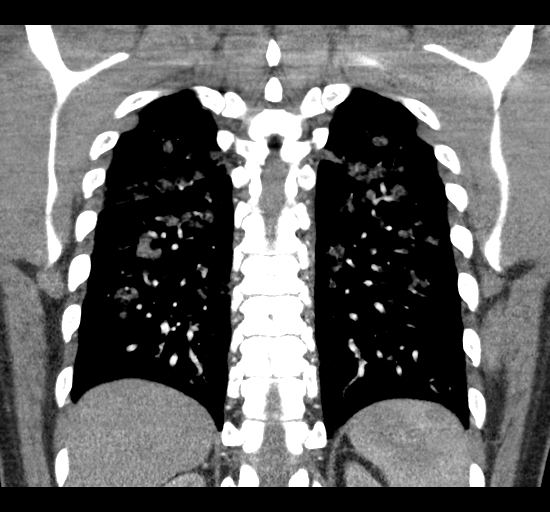

[19 of 46 positions shown; findings below may reference images not displayed]

FINDINGS: Cardiovascular: Satisfactory opacification of the pulmonary arteries
to the segmental level. No evidence of pulmonary embolism.
Nonaneurysmal aorta. No dissection is seen. Normal heart size. No
pericardial effusion.

Mediastinum/Nodes: No enlarged mediastinal, hilar, or axillary lymph
nodes. Thyroid gland, trachea, and esophagus demonstrate no
significant findings.

Lungs/Pleura: Relatively symmetric bilateral nodular areas of
consolidation and ground-glass density. No pleural effusion. No
pneumothorax.

Upper Abdomen: No acute abnormality.

Musculoskeletal: No chest wall abnormality. No acute or significant
osseous findings.

Review of the MIP images confirms the above findings.
IMPRESSION: 1. Negative for acute pulmonary embolus or aortic dissection
2. Relatively symmetric bilateral nodular consolidations and
ground-glass densities; given history, most likely represents
alveolar hemorrhage. Could also consider inhalation injury or
diffuse pneumonia if clinical symptoms are suggestive of infection.

## 2019-02-06 IMAGING — CR DG CHEST 2V
2 series · 2 of 2 positions shown · non-contrast
Comparison: Chest radiograph and chest CT May 14, 2017

CLINICAL DATA: Recent hemoptysis and pulmonary infiltrates.

EXAM:
CHEST - 2 VIEW

[chest pa]
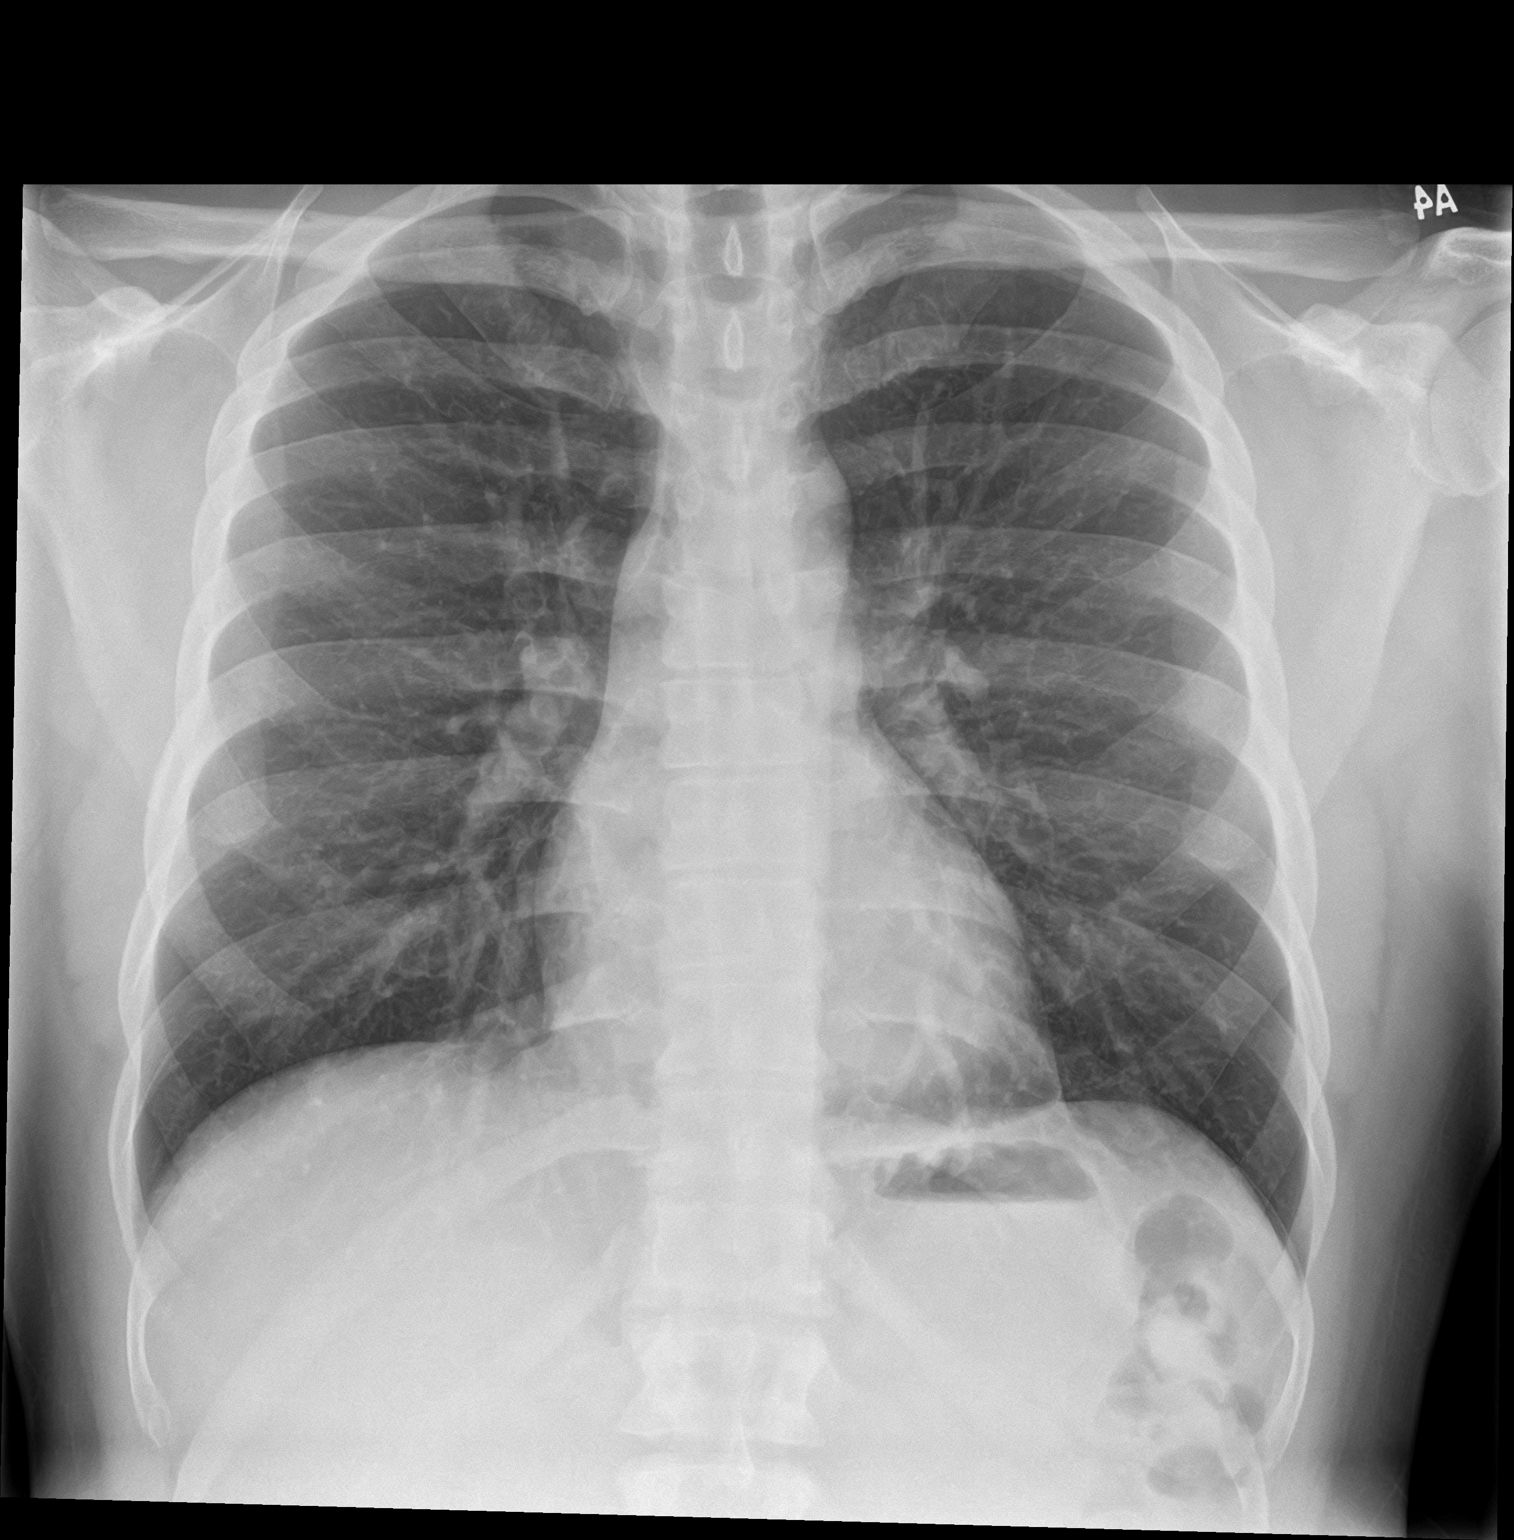

[chest lat]
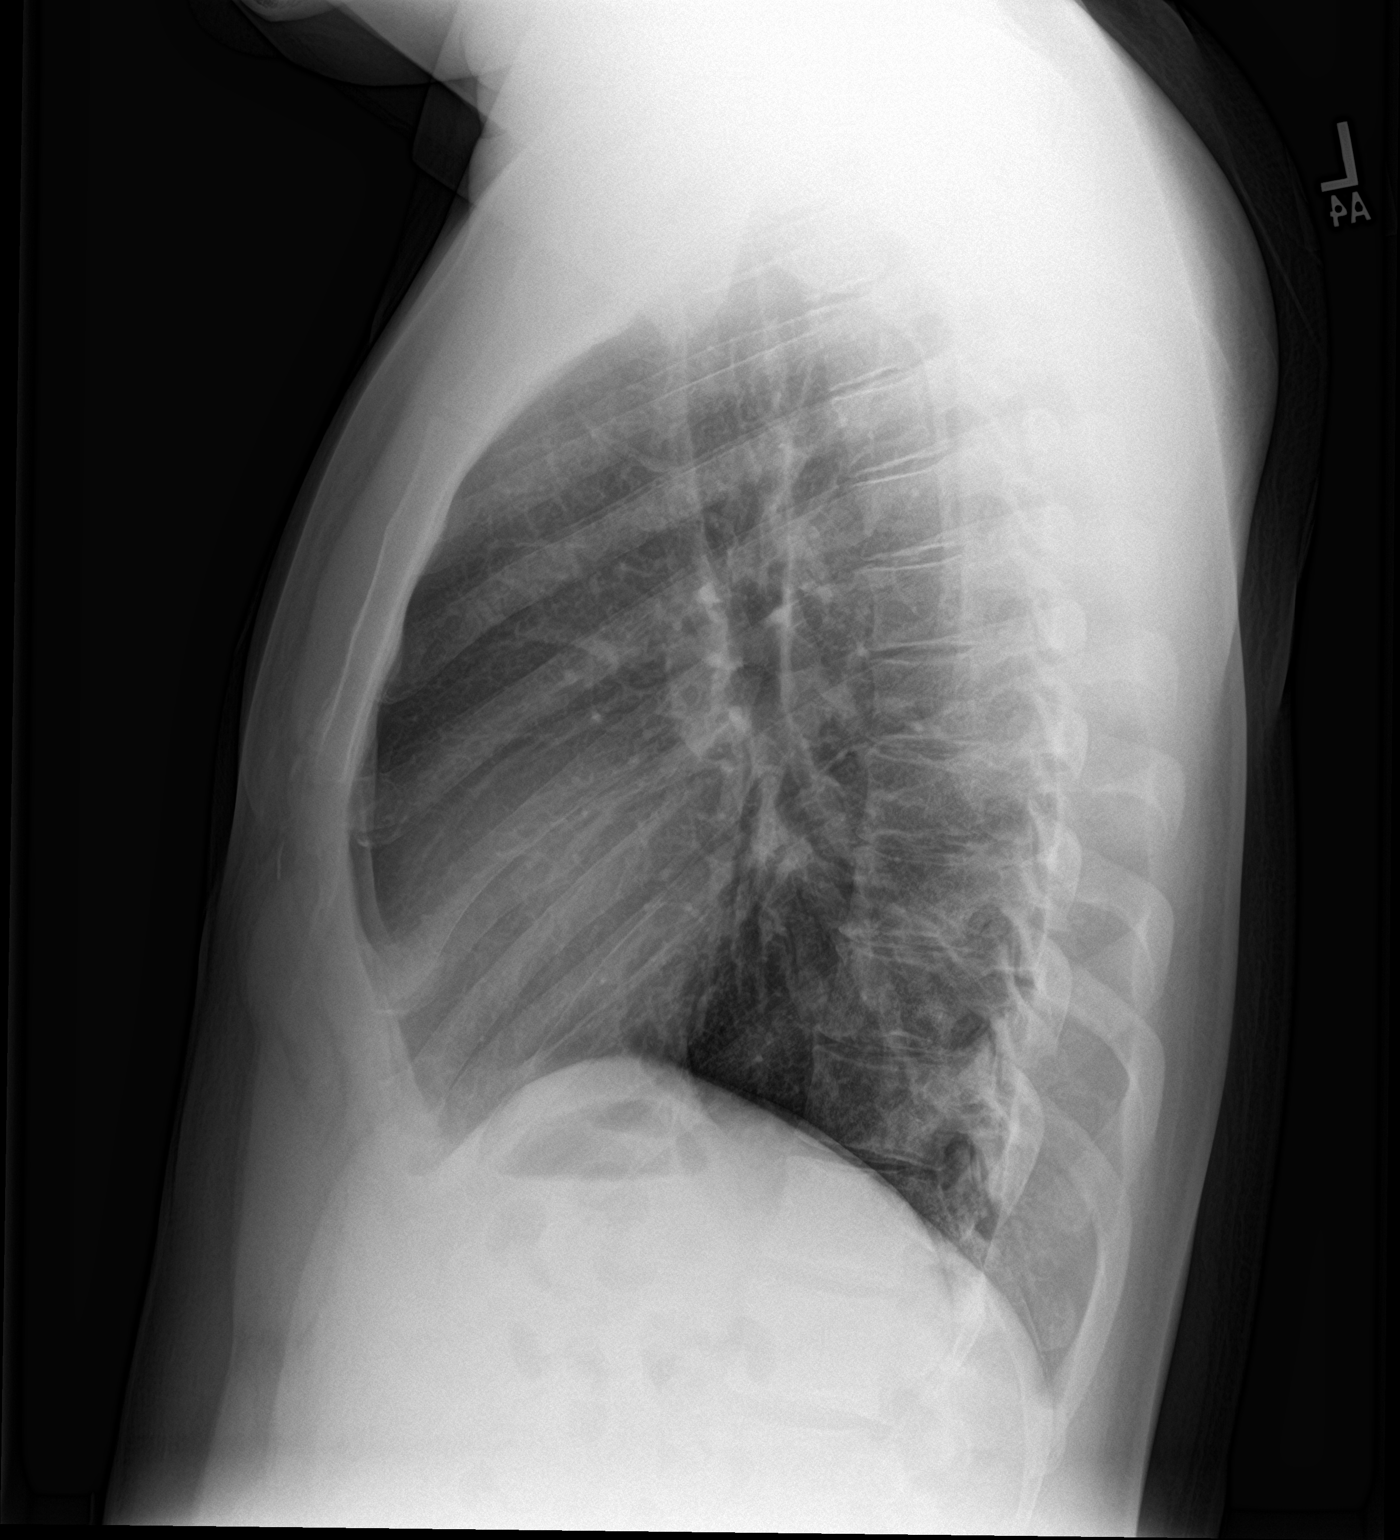

[2 of 2 positions shown; findings below may reference images not displayed]

FINDINGS: Lungs are now clear. Heart size and pulmonary vascularity are
normal. No adenopathy. No bone lesions.
IMPRESSION: Extensive airspace opacities noted bilaterally on recent prior
studies have cleared. Currently lungs are clear. Heart size normal.
No adenopathy.
